# Patient Record
Sex: Male | Born: 1990
Health system: Southern US, Community
[De-identification: ages and names within clinical notes are randomized; demographics above are authoritative.]

## PROBLEM LIST (undated history)

## (undated) DIAGNOSIS — T7840XA Allergy, unspecified, initial encounter: Secondary | ICD-10-CM

## (undated) HISTORY — PX: ANTERIOR CRUCIATE LIGAMENT REPAIR: SHX115

## (undated) HISTORY — DX: Allergy, unspecified, initial encounter: T78.40XA

---
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
## 2010-02-04 ENCOUNTER — Telehealth: Payer: Self-pay | Admitting: Family Medicine

## 2010-02-11 ENCOUNTER — Encounter: Payer: Self-pay | Admitting: Family Medicine

## 2010-02-24 ENCOUNTER — Ambulatory Visit: Payer: Self-pay | Admitting: Family Medicine

## 2010-02-24 DIAGNOSIS — E663 Overweight: Secondary | ICD-10-CM | POA: Insufficient documentation

## 2010-03-31 ENCOUNTER — Ambulatory Visit: Payer: Self-pay | Admitting: Family Medicine

## 2010-03-31 DIAGNOSIS — F988 Other specified behavioral and emotional disorders with onset usually occurring in childhood and adolescence: Secondary | ICD-10-CM

## 2010-04-01 ENCOUNTER — Ambulatory Visit: Payer: Self-pay | Admitting: Psychology

## 2010-04-08 ENCOUNTER — Ambulatory Visit: Payer: Self-pay | Admitting: Psychology

## 2010-04-09 ENCOUNTER — Ambulatory Visit: Payer: Self-pay | Admitting: Psychology

## 2010-04-09 ENCOUNTER — Telehealth: Payer: Self-pay | Admitting: Family Medicine

## 2010-04-09 ENCOUNTER — Encounter: Payer: Self-pay | Admitting: Family Medicine

## 2010-05-02 ENCOUNTER — Telehealth: Payer: Self-pay | Admitting: Family Medicine

## 2010-05-22 ENCOUNTER — Telehealth: Payer: Self-pay | Admitting: Family Medicine

## 2010-05-27 ENCOUNTER — Telehealth: Payer: Self-pay | Admitting: Family Medicine

## 2010-06-13 ENCOUNTER — Ambulatory Visit: Payer: Self-pay | Admitting: Family Medicine

## 2010-06-13 DIAGNOSIS — J45909 Unspecified asthma, uncomplicated: Secondary | ICD-10-CM | POA: Insufficient documentation

## 2010-07-15 ENCOUNTER — Telehealth: Payer: Self-pay | Admitting: Family Medicine

## 2010-08-22 ENCOUNTER — Telehealth: Payer: Self-pay | Admitting: Family Medicine

## 2010-10-07 NOTE — Progress Notes (Signed)
Summary: wants to change to vyvanse  Phone Note Call from Patient Call back at Home Phone [phone]   Caller: Mom- Ala Bent Summary of Call: Mother picked up ritalin script yesterday but is unable to find a pharmacy that has this dose in stock.  One pharmacy told her there is a Set designer problem.  She is asking if pt can change to vyvanse, which some of his friends are on. Initial call taken by: Lowella Petties CMA,  May 27, 2010 9:51 AM  Follow-up for Phone Call        I will call and d/w patient.  Follow-up by: [name] MD,  May 27, 2010 1:59 PM  Additional Follow-up for Phone Call Additional follow up Details #1::        I called patient and d/w him.  We are both okay with the change.  I gave routine precautions for this med.  I want his mother to drop off the old rx and pick up the new rx for vyvanse.  He'll let me know if he has any trouble on vyvanse.   Additional Follow-up by: [name] MD,  May 27, 2010 3:15 PM    Additional Follow-up for Phone Call Additional follow up Details #2::    Mom advised.  Prescription left at front desk.  Follow-up by: Delilah Shan CMA (AAMA),  May 27, 2010 3:24 PM  New/Updated Medications: VYVANSE 30 MG CAPS (LISDEXAMFETAMINE DIMESYLATE) 1 by mouth qam Prescriptions: VYVANSE 30 MG CAPS (LISDEXAMFETAMINE DIMESYLATE) 1 by mouth qam  #30 x 0   Entered and Authorized by:   [name] MD   Signed by:   [name] MD on 05/27/2010   Method used:   Print then Give to Patient   RxID:   [id number]

## 2010-10-07 NOTE — Letter (Signed)
Summary: [facility]  Dr.Jane Perrin,Behavioral Medicine Note   Imported By: Beau Fanny 04/22/2010 13:45:16  _____________________________________________________________________  External Attachment:    Type:   Image     Comment:   External Document

## 2010-10-07 NOTE — Progress Notes (Signed)
Summary: pt requests phone call  Phone Note Call from Patient Call back at [phone]   Caller: Patient Call For: [name] MD Summary of Call: Pt states that current dose of ritalin is not strong enough.  He would like for you to call him to discuss. Initial call taken by: Lowella Petties CMA,  May 22, 2010 11:49 AM  Follow-up for Phone Call        d/w pt.  No intolerance of med but lack of effect noted.  I d/w pt [id number].  We will change to regular ritalin 10mg , 2po qAM and 1-2 by mouth later in the day.  Most of his studying/classes are from 9-3, so this may provide enough help.  If no relief, either followint up at his school clinic or possibly changing to vyvanse could be considered.  He'll call back with update.  Rx is printed and signed.  Please notify his mother so she can pick it up.  Thanks.  Follow-up by: [name] MD,  May 22, 2010 1:53 PM  Additional Follow-up for Phone Call Additional follow up Details #1::        Left message with Dad for pickup. Lugene Fuquay CMA (AAMA)  May 22, 2010 5:16 PM     New/Updated Medications: RITALIN 10 MG TABS (METHYLPHENIDATE HCL) 2 by mouth qAM and 1-2 by mouth again 4 hours later Prescriptions: RITALIN 10 MG TABS (METHYLPHENIDATE HCL) 2 by mouth qAM and 1-2 by mouth again 4 hours later  #120 x 0   Entered and Authorized by:   [name] MD   Signed by:   [name] MD on 05/22/2010   Method used:   Print then Give to Patient   RxID:   [id number]

## 2010-10-07 NOTE — Progress Notes (Signed)
Summary: [facility] order  Phone Note Call from Patient Call back at Home Phone [phone]   Caller: Mom Call For: Shaune Leeks MD Summary of Call: Patient needs an order for labs to be drawn at [facility].  He is scheduled for a physical on 02/18/2010.  Mom says he is 88 but he is very over-weight and any labs that need to be drawn are free to them at [facility].  Please send order to home address. Initial call taken by: Delilah Shan CMA Duncan Dull),  Feb 04, 2010 11:33 AM  Follow-up for Phone Call        Will get Bmet, Hepatic Panel, CBC, TSH,T4Fr, Chol Prof, AM Cortisol, CPeptide. Request in out box. Follow-up by: Shaune Leeks MD,  February 05, 2010 7:16 AM  Additional Follow-up for Phone Call Additional follow up Details #1::        Order mailed as requested Additional Follow-up by: Delilah Shan CMA Duncan Dull),  February 05, 2010 8:54 AM

## 2010-10-07 NOTE — Progress Notes (Signed)
Summary: pt requests phone call  Phone Note Refill Request Call back at [phone] Message from:  Patient  Refills Requested: Medication #1:  VYVANSE 50 MG CAPS 1 by mouth qAM Pt says this is not working for him.  He has problems waking up and is having difficulty focusing.  He says this is keeping him too calm.  He is requesting that you call him regarding this.  He is asking to go back on ritalin.  He says the pharmacies in [place] have the brand name.  Initial call taken by: Lowella Petties CMA, AAMA,  July 15, 2010 4:38 PM  Follow-up for Phone Call        I talked to the patient.  We had switched off ritalin due to availability issues.  He hasn't had good effect with vyvanse.  His energy level is decreased with this.  We'll switch back to ritalin 10mg , 2 by mouth qAM and 1-2 by mouth qday at lunch time.  His mother is going to call tomorrow to make arrangements about picking up the rx.    Pt denies substance use.  His concentration level has decreased on the vyvanse compared to the ritalin.  Follow-up by: [name] MD,  July 15, 2010 5:35 PM  Additional Follow-up for Phone Call Additional follow up Details #1::        Prescription left at front desk.  Additional Follow-up by: Delilah Shan CMA (AAMA),  July 16, 2010 2:05 PM    New/Updated Medications: RITALIN 10 MG TABS (METHYLPHENIDATE HCL) 2 by mouth qAM and 1-2 by mouth at lunch time. Prescriptions: RITALIN 10 MG TABS (METHYLPHENIDATE HCL) 2 by mouth qAM and 1-2 by mouth at lunch time.  #120 x 0   Entered by:   Delilah Shan CMA (AAMA)   Authorized by:   [name] MD   Signed by:   Delilah Shan CMA (AAMA) on 07/16/2010   Method used:   Print then Give to Patient   RxID:   [id number] RITALIN 10 MG TABS (METHYLPHENIDATE HCL) 2 by mouth qAM and 1-2 by mouth at lunch time.  #120 x 0   Entered and Authorized by:   [name] MD   Signed by:   [name] MD on 07/15/2010   Method used:   Historical  RxID:   [id number]

## 2010-10-07 NOTE — Progress Notes (Signed)
Summary: regarding ritalin strength  Phone Note Call from Patient Call back at [phone]   Caller: Mom Call For: [name] MD Action Taken: Patient advised to call 911 Summary of Call: Mother picked up script for ritalin 30 mg's but Rob at [place] told her they dont carry that strength, and dont know who would.  He told her they only carry 10 and 20 mg's.  Mother wants to make sure this strength is available, or else she can get two seperate scripts. Initial call taken by: Lowella Petties CMA,  May 02, 2010 2:38 PM  Follow-up for Phone Call        I will print rxs for 10 and 20 mg.  take one of each in the AM for total of 30mg .  Please document that the old rx for 30mg  was destroyed.  Rxs are in my outbox in my office.  Follow-up by: [name] MD,  May 02, 2010 2:41 PM  Additional Follow-up for Phone Call Additional follow up Details #1::        Mother is trying to find a pharmacy that carries 30 mg's.  I told her we will need to have that script back, if she is unable to get it filled, before we can give her the new scripts.               Lowella Petties CMA  May 02, 2010 3:06 PM  Mother called back, is now asking for a script for 10 mg's, to take 3 a day- so that she wont have to pay for 2 scripts. Additional Follow-up by: Lowella Petties CMA,  May 02, 2010 3:12 PM    Additional Follow-up for Phone Call Additional follow up Details #2::    done, please document that the prev were destroyed.  new rx is in my outbox.  thanks.  Follow-up by: [name] MD,  May 02, 2010 3:15 PM  Additional Follow-up for Phone Call Additional follow up Details #3:: Details for Additional Follow-up Action Taken: Mother given new script, other scripts ripped up and thrown away in shred box. Additional Follow-up by: Lowella Petties CMA,  May 02, 2010 4:16 PM  New/Updated Medications: RITALIN LA 10 MG XR24H-CAP (METHYLPHENIDATE HCL) 1 by mouth qAM RITALIN LA 10 MG XR24H-CAP  (METHYLPHENIDATE HCL) 3 by mouth qAM RITALIN LA 20 MG XR24H-CAP (METHYLPHENIDATE HCL) 1 by mouth qAM Prescriptions: RITALIN LA 10 MG XR24H-CAP (METHYLPHENIDATE HCL) 3 by mouth qAM  #90 x 0   Entered and Authorized by:   [name] MD   Signed by:   [name] MD on 05/02/2010   Method used:   Print then Give to Patient   RxID:   [id number] RITALIN LA 20 MG XR24H-CAP (METHYLPHENIDATE HCL) 1 by mouth qAM  #30 x 0   Entered and Authorized by:   [name] MD   Signed by:   [name] MD on 05/02/2010   Method used:   Print then Give to Patient   RxID:   [id number] RITALIN LA 10 MG XR24H-CAP (METHYLPHENIDATE HCL) 1 by mouth qAM  #30 x 0   Entered and Authorized by:   [name] MD   Signed by:   [name] MD on 05/02/2010   Method used:   Print then Give to Patient   RxID:   [id number]

## 2010-10-07 NOTE — Assessment & Plan Note (Signed)
Summary: DISCUSS MEDICATION/CLE   Vital Signs:  Patient profile:   20 year old male Height:      74 inches Weight:      287.75 pounds BMI:     37.08 Temp:     98.1 degrees F oral Pulse rate:   80 / minute Pulse rhythm:   regular BP sitting:   126 / 84  (left arm) Cuff size:   large  Vitals Entered By: Delilah Shan CMA Duncan Dull) (June 13, 2010 12:20 PM) CC: Discuss medication   History of Present Illness: H/o episodic asthma/wheeze flare.  Needs refill on SABA.  ADD- See prev phone notes.  Little relief with ritalin in terms of duration.  Has taken vyvanse w/o adverse effect.  Duration is better but peak effect of med doesn't control symptoms.  Compliant with meds:yes benefit from med (ie increase in concentration): mild change in mood:no change in appetite:no Insomnia:no tremor:no compliant with behavioral modification: yes, uses phone to stay organized.  Motivated Consulting civil engineer.   Allergies: 1)  ! Augmentin 2)  ! Duricef  Past History:  Past Medical History: Allergic Rhinitis  05/1997 ADD Asthma  Review of Systems       See HPI.  Otherwise negative.    Physical Exam  General:  GEN: nad, alert and oriented, affect wnl and appropriate HEENT: mucous membranes moist NECK: supple w/o LA CV: rrr.  PULM: ctab, no inc wob ABD: soft, +bs EXT: no edema CN 2-12 wnl, s/s/dtr wnl x4.  No tremor.   able to recall 7 digits forward easily but trouble with reversing 4 digits, on meds today.    Impression & Recommendations:  Problem # 1:  ASTHMA (ICD-493.90) Refill SABA and notify clinic if needing more than 2x/week His updated medication list for this problem includes:    Proair Hfa 108 (90 Base) Mcg/act Aers (Albuterol sulfate) .Marland Kitchen... 2 puffs q4h as needed for wheezing  Problem # 2:  ADD (ICD-314.00) >25 min spent with patient, at least half of which was spent on counseling ZO:XWRU.  Will increase vyvanse and patient to call back with update.  He agrees with the plan, as  does his mother who was here today.  BP is okay and he has tolerated the med.    Complete Medication List: 1)  Zyrtec Allergy 10 Mg Tabs (Cetirizine hcl) .... One tab by mouth at night as needed 2)  Vyvanse 50 Mg Caps (Lisdexamfetamine dimesylate) .Marland Kitchen.. 1 by mouth qam 3)  Proair Hfa 108 (90 Base) Mcg/act Aers (Albuterol sulfate) .... 2 puffs q4h as needed for wheezing  Patient Instructions: 1)  Use the higher dose of vyvanse and let me know how you are doing.  Use the albuterol as needed.  Take care.  Prescriptions: PROAIR HFA 108 (90 BASE) MCG/ACT AERS (ALBUTEROL SULFATE) 2 puffs q4h as needed for wheezing  #1 x 12   Entered and Authorized by:   [name] MD   Signed by:   [name] MD on 06/13/2010   Method used:   Print then Give to Patient   RxID:   [id number] VYVANSE 50 MG CAPS (LISDEXAMFETAMINE DIMESYLATE) 1 by mouth qAM  #30 x 0   Entered and Authorized by:   [name] MD   Signed by:   [name] MD on 06/13/2010   Method used:   Print then Give to Patient   RxID:   [id number]   Current Allergies (reviewed today): ! AUGMENTIN ! DURICEF

## 2010-10-07 NOTE — Progress Notes (Signed)
  Phone Note Outgoing Call   Call placed by: [name] MD,  April 09, 2010 5:28 PM Summary of Call: I called patient.  We discussed his ADD testing.  He may benefit from treatment.  I d/w him re: possible adverse effects (including sleep and mood changes, tremor, dec in appetite).  He understood.  He knows not to take the medicine in any way other than as prescribed.  Failure to comply would prevent further prescribing and he understood.  He will take the med two times a day, before late morning classes and then again in the afternoon before studying.  He will call back with update in about 1-2 weeks.  He'll pick up the rx tomorrow here at the clinic.  He knows that he is not to abuse this medication.  he agrees with the plan.  We can determine the timing for follow up (and the arrangements for picking up further rxs) based on his response to the meds.  he knows not to use any illicit agents.  Per Raechel Ache, M.D./lsf  Follow-up for Phone Call        Patient Advised. Prescription left at front desk.  Follow-up by: Delilah Shan CMA (AAMA),  April 10, 2010 8:30 AM    New/Updated Medications: RITALIN 10 MG TABS (METHYLPHENIDATE HCL) 1 by mouth two times a day Prescriptions: RITALIN 10 MG TABS (METHYLPHENIDATE HCL) 1 by mouth two times a day  #60 x 0   Entered and Authorized by:   [name] MD   Signed by:   [name] MD on 04/09/2010   Method used:   Print then Give to Patient   RxID:   [id number]

## 2010-10-07 NOTE — Consult Note (Signed)
Summary: [facility]  [facility]   Imported By: Lanelle Bal 04/18/2010 09:08:36  _____________________________________________________________________  External Attachment:    Type:   Image     Comment:   External Document

## 2010-10-07 NOTE — Assessment & Plan Note (Signed)
Summary: ATTENTION DEFICIT EVAL AND TRMT PER DR. SCHALLER   Vital Signs:  Patient profile:   20 year old male Weight:      298.75 pounds Temp:     98.0 degrees F oral Pulse rate:   68 / minute Pulse rhythm:   regular BP sitting:   128 / 82  (left arm) Cuff size:   large  Vitals Entered By: Sydell Axon LPN (March 31, 2010 9:16 AM) CC: ADD evaluation   History of Present Illness: Possible ADD. "It's always been a problem but I didn't see until I was in college."  Trouble concentrating and focusing.  Difficulty studying unless patient has very quiet and isolated environment.  Academic probation 1st semester (biology, english, econ) and dean's list second semester.  Pt had lighter load in second semester.  "I'm good at tests.  The classes were easier back in high school and I was getting by."  Wants to go to vet school.  Has worked to improve study habits.   No tob, rare alcohol. No illicits.  Living off campus.  Going back to [facility] August 5th.  Apartment is disorganized.  "I'm not good with organization."   Some help with lists.    3 speeding tickets.  Brother with possible ADHD.    Patient was prev dx'd with ADD in 2nd grade.  Was never on meds.  No SI/HI.    Problems Prior to Update: 1)  ? of Add  (ICD-314.00) 2)  Overweight  (ICD-278.02) 3)  Health Maintenance Exam  (ICD-V70.0) 4)  Allergic Rhinitis  (ICD-477.9) 5)  S/P Allergic Rhinitis  (ICD-477.9)  Allergies: 1)  ! Augmentin 2)  ! Duricef  Social History: Siblings:1 Scientist, research (physical sciences), wants to go to [employer] school.  [employer] grad no illicits, no tob.  minimal alcohol   Review of Systems       See HPI.  Otherwise negative.    Physical Exam  General:  GEN: nad, alert and oriented, affect wnl and appropriate HEENT: mucous membranes moist NECK: supple w/o LA CV: rrr.  PULM: ctab, no inc wob ABD: soft, +bs EXT: no edema CN 2-12 wnl, s/s/dtr wnl x4.  No tremor.     Impression & Recommendations:  Problem # 1:  ? of  ADD (ICD-314.00) 25 min spent with patient.  Refer for testing and contact patient after that is done.  I can call patient and discuss with him.  No meds at this point.  He agrees with plan.  I talked with the patient about basics of treatment (meds and behavior/lifestyle changes).    Orders: Psychology Referral (Psychology)  Complete Medication List: 1)  Zyrtec Allergy 10 Mg Tabs (Cetirizine hcl) .... One tab by mouth at night 2)  Fluticasone Propionate 50 Mcg/act Susp (Fluticasone propionate) .... One inh each nostril two times a day as needed  Patient Instructions: 1)  See Aram Beecham about your referral before your leave today.  We'll contact you with your test report.    Current Allergies (reviewed today): ! AUGMENTIN ! DURICEF

## 2010-10-07 NOTE — Progress Notes (Signed)
Summary: ritalin is not helping  Phone Note Call from Patient Call back at [phone]   Caller: Patient Summary of Call: Pt was prescribed generic ritalin 3 or 4 weeks ago and this is not helping at all.  He has tried taking 2 in the mornings, because all of his classes are in the morning,  this helped for awhile, but now not at all.  He didnt take the mid day dose when he took 2 in the mornings. Initial call taken by: Lowella Petties CMA,  May 02, 2010 11:27 AM  Follow-up for Phone Call        Called and LMOVM.  Will await return call.  Follow-up by: [name] MD,  May 02, 2010 11:45 AM  Additional Follow-up for Phone Call Additional follow up Details #1::        Patient returned  call. [phone] Melody Comas  May 02, 2010 11:46 AM     Additional Follow-up for Phone Call Additional follow up Details #2::    I called back.  Pt had some effect intially but it wore off quickly.  He is almost of the medicine. I talked with him about options.  We can increase the total dosage by 10mg  and change to LA form.  he had no mood changes or tremor on the prev dose.  He'll report back re: symptoms/effect.  A family member will come pick up the rx today.  Follow-up by: [name] MD,  May 02, 2010 1:54 PM  Additional Follow-up for Phone Call Additional follow up Details #3:: Details for Additional Follow-up Action Taken: Prescription left at front desk.  Additional Follow-up by: Delilah Shan CMA (AAMA),  May 02, 2010 2:07 PM  New/Updated Medications: RITALIN LA 30 MG XR24H-CAP (METHYLPHENIDATE HCL) 1 by mouth qAM Prescriptions: RITALIN LA 30 MG XR24H-CAP (METHYLPHENIDATE HCL) 1 by mouth qAM  #30 x 0   Entered and Authorized by:   [name] MD   Signed by:   [name] MD on 05/02/2010   Method used:   Print then Give to Patient   RxID:   [id number]

## 2010-10-07 NOTE — Assessment & Plan Note (Signed)
Summary: CPX/CLE   Vital Signs:  Patient profile:   20 year old male Height:      74 inches Weight:      294.25 pounds BMI:     37.92 Temp:     97.4 degrees F oral Pulse rate:   68 / minute Pulse rhythm:   regular BP sitting:   110 / 74  (left arm) Cuff size:   large  Vitals Entered By: Sydell Axon LPN (February 24, 2010 8:24 AM) CC: 30 Minute checkup   History of Present Illness: Pt here for PE, going to [employer] in [place]. He is hoping to go to [employer]. He was on the Dean's List this last semester. Pt says he is worried about his weight. He has tried "everything" to lose weight.  Pt also OBTW mentions difficulty concentrating and staying focused on school work and studying.  Preventive Screening-Counseling & Management  Alcohol-Tobacco     Alcohol drinks/day: <1     Alcohol type: all     Smoking Status: never  Caffeine-Diet-Exercise     Caffeine use/day: 1     Does Patient Exercise: yes     Type of exercise: running, exercises     Exercise (avg: min/session): 30-60     Times/week: 3  Problems Prior to Update: 1)  Chest Pain, Acute  (ICD-786.50) 2)  Health Maintenance Exam  (ICD-V70.0) 3)  Allergic Rhinitis  (ICD-477.9) 4)  S/P Allergic Rhinitis  (ICD-477.9)  Medications Prior to Update: 1)  Singulair 10 Mg Tabs (Montelukast Sodium) .Marland Kitchen.. 1 Qd 2)  Zyrtec Allergy 10 Mg  Tabs (Cetirizine Hcl) .... One Tab By Mouth At Night  Allergies: 1)  ! Augmentin 2)  ! Duricef  Past History:  Past Medical History: Last updated: 01/03/2007 Allergic Rhinitis  05/1997  Past Surgical History: Last updated: 10/17/2008 CT Abd/Pelvis nml 04/09/07 R ACL Reconstruction (Dr Thomasena Edis)  08/25/2007  Family History: Last updated: 02/24/2010 Father: ALIVE 53  Mother: ALIVE 32 BROTHER A 23 CV: NEGATIVE  HBP: NEGATIVE GOUT/ARTHRITIS: NEG. PROSTATE/CANCER: NEG. BREAST/OVARIAN/UTERINE CANCER NEGATIVE COLON CANCER:  DEPRESSION: NEGATIVE ETOH/DRUG ABUSE:  NEGATIVE OTHER: NEGATIVE STROKE  Social History: Last updated: 04/05/2007 Mother: Father:  Siblings:1 BROTHER OLDER  School name: [employer] Grade:  Hobbies:  Risk Factors: Alcohol Use: <1 (02/24/2010) Caffeine Use: 1 (02/24/2010) Exercise: yes (02/24/2010)  Risk Factors: Smoking Status: never (02/24/2010) Passive Smoke Exposure: no (01/06/2008)  Family History: Father: ALIVE 23  Mother: ALIVE 44 BROTHER A 23 CV: NEGATIVE  HBP: NEGATIVE GOUT/ARTHRITIS: NEG. PROSTATE/CANCER: NEG. BREAST/OVARIAN/UTERINE CANCER NEGATIVE COLON CANCER:  DEPRESSION: NEGATIVE ETOH/DRUG ABUSE: NEGATIVE OTHER: NEGATIVE STROKE  Physical Exam  General:  Well-developed,well-nourished,in no acute distress; alert,appropriate and cooperative throughout examination, stocky but significantly better proportioned than previously. Head:  Normocephalic and atraumatic without obvious abnormalities. No apparent alopecia or balding. Sinuses NT. Eyes:  Conjunctiva clear bilaterally.  Ears:  External ear exam shows no significant lesions or deformities.  Otoscopic examination reveals clear canals, tympanic membranes are intact bilaterally without bulging, retraction, inflammation or discharge. Hearing is grossly normal bilaterally. Nose:  External nasal examination shows no deformity or inflammation. Nasal mucosa are pink and moist without lesions or exudates. Mild congestion of nasal passages with dried mucous. Mouth:  Oral mucosa and oropharynx without lesions or exudates.  Teeth in good repair. Neck:  No deformities, masses, or tenderness noted. Chest Wall:  No deformities, masses, tenderness or gynecomastia noted. Breasts:  No masses, mild physiologic gynecomastia noted Lungs:  Normal respiratory  effort, chest expands symmetrically. Lungs are clear to auscultation, no crackles or wheezes. Heart:  Normal rate and regular rhythm. S1 and S2 normal without gallop, murmur, click, rub or other extra  sounds. Abdomen:  Bowel sounds positive,abdomen soft and non-tender without masses, organomegaly or hernias noted. Rectal:  Not done. Genitalia:  Testes bilaterally descended without nodularity, tenderness or masses. No scrotal masses or lesions. No penis lesions or urethral discharge. Msk:  No deformity or scoliosis noted of thoracic or lumbar spine.   Pulses:  R and L carotid,radial,femoral,dorsalis pedis and posterior tibial pulses are full and equal bilaterally Extremities:  No clubbing, cyanosis, edema, or deformity noted with normal full range of motion of all joints.   Neurologic:  No cranial nerve deficits noted. Station and gait are normal. Sensory, motor and coordinative functions appear intact. Skin:  Intact without suspicious lesions or rashes Cervical Nodes:  No lymphadenopathy noted Inguinal Nodes:  No significant adenopathy Psych:  Cognition and judgment appear intact. Alert and cooperative with normal attention span and concentration. No apparent delusions, illusions, hallucinations   Impression & Recommendations:  Problem # 1:  HEALTH MAINTENANCE EXAM (ICD-V70.0)  Reviewed preventive care protocols, scheduled due services, and updated immunizations. Discussed diet and exercise. Suggested reading "Protein Power" Discussed ETOH, sex, drugs and smoking.  Problem # 2:  CHEST PAIN, ACUTE (ICD-786.50) Assessment: Improved Resolved.  Problem # 3:  ALLERGIC RHINITIS (ICD-477.9) Assessment: Unchanged Refer to allergy for eval. Try Fluticasone in the interim Nasally. His updated medication list for this problem includes:    Zyrtec Allergy 10 Mg Tabs (Cetirizine hcl) ..... One tab by mouth at night    Fluticasone Propionate 50 Mcg/act Susp (Fluticasone propionate) ..... One inh each nostril two times a day as needed  Orders: Allergy Referral  (Allergy)  Problem # 4:  OVERWEIGHT (ICD-278.02) Assessment: Unchanged Discussed diet and exercise.  Complete Medication  List: 1)  Zyrtec Allergy 10 Mg Tabs (Cetirizine hcl) .... One tab by mouth at night 2)  Fluticasone Propionate 50 Mcg/act Susp (Fluticasone propionate) .... One inh each nostril two times a day as needed  Patient Instructions: 1)  Refer for alllergy eval. 2)  Make appt with Dr Para March as soon as possible for Atenetion Deficit eval and trmt.  Prescriptions: FLUTICASONE PROPIONATE 50 MCG/ACT SUSP (FLUTICASONE PROPIONATE) one inh each nostril two times a day as needed  #1MDI x 12   Entered and Authorized by:   Shaune Leeks MD   Signed by:   Shaune Leeks MD on 02/24/2010   Method used:   Print then Give to Patient   RxID:   [id number]   Current Allergies (reviewed today): ! AUGMENTIN ! DURICEF

## 2010-10-09 NOTE — Progress Notes (Signed)
Summary: refill request for ritalin  Phone Note Refill Request Call back at [phone] Message from:  mother Daden Mahany  Refills Requested: Medication #1:  RITALIN 10 MG TABS 2 by mouth qAM and 1-2 by mouth at lunch time. Please call mother when ready, monday is fine.  Initial call taken by: Lowella Petties CMA, AAMA,  August 22, 2010 3:23 PM  Follow-up for Phone Call        rx signed.  Follow-up by: [name] MD,  August 22, 2010 3:30 PM  Additional Follow-up for Phone Call Additional follow up Details #1::        Mother notified of rx. Rx left in front office for pick up.  Additional Follow-up by: Melody Comas,  August 22, 2010 3:40 PM    Prescriptions: RITALIN 10 MG TABS (METHYLPHENIDATE HCL) 2 by mouth qAM and 1-2 by mouth at lunch time.  #120 x 0   Entered and Authorized by:   [name] MD   Signed by:   [name] MD on 08/22/2010   Method used:   Print then Give to Patient   RxID:   [id number]

## 2010-10-24 ENCOUNTER — Telehealth: Payer: Self-pay | Admitting: Family Medicine

## 2010-10-29 NOTE — Progress Notes (Signed)
Summary: Need med refill-Ritailin  Phone Note Call from Patient   Caller: Mom Call For: [name] MD Summary of Call: Mom called, need Ritalin refilled.Marland KitchenMarland KitchenMom call back # [phone].Marland KitchenDaine Gip  October 24, 2010 3:08 PM  Initial call taken by: Daine Gip,  October 24, 2010 3:08 PM  Follow-up for Phone Call        please give to patient/mother.  Follow-up by: [name] MD,  October 24, 2010 3:33 PM  Additional Follow-up for Phone Call Additional follow up Details #1::        Mom advised.  Prescription left at front desk.  Additional Follow-up by: Delilah Shan CMA (AAMA),  October 24, 2010 3:55 PM    Prescriptions: RITALIN 10 MG TABS (METHYLPHENIDATE HCL) 2 by mouth qAM and 1-2 by mouth at lunch time.  #120 x 0   Entered and Authorized by:   [name] MD   Signed by:   [name] MD on 10/24/2010   Method used:   Print then Give to Patient   RxID:   [id number]

## 2010-12-31 ENCOUNTER — Other Ambulatory Visit: Payer: Self-pay | Admitting: *Deleted

## 2010-12-31 MED ORDER — METHYLPHENIDATE HCL ER (LA) 10 MG PO CP24: ORAL_CAPSULE | ORAL | Status: DC

## 2010-12-31 NOTE — Telephone Encounter (Signed)
Mom picked up Rx

## 2010-12-31 NOTE — Telephone Encounter (Signed)
Have pt schedule appointment for the next 2-3 months.  Thanks.

## 2011-01-20 NOTE — Assessment & Plan Note (Signed)
[facility]                                 ON-CALL NOTE   DELMONT, PROSCH                         MRN:          [id number]  DATE:04/09/2007                            DOB:          [birth year]    TIME OF CALL:  10:39am.   REGULAR DOCTOR:  Dr. Hetty Ely.   TELEPHONE NUMBER:  [phone]   CALLER:  Ala Bent.   She called to say that Marty has had an event with possible broken ribs.  They were told to go ahead and take him to the emergency room for  evaluation and x-rays.     Marne A. Tower, MD  Electronically Signed    MAT/MedQ  DD: 04/09/2007  DT: 04/10/2007  Job #: [id number]   cc:   Arta Silence, MD

## 2011-04-21 ENCOUNTER — Other Ambulatory Visit: Payer: Self-pay | Admitting: *Deleted

## 2011-04-21 MED ORDER — METHYLPHENIDATE HCL ER (LA) 10 MG PO CP24: ORAL_CAPSULE | ORAL | Status: DC

## 2011-04-21 MED ORDER — METHYLPHENIDATE HCL 10 MG PO TABS: ORAL_TABLET | ORAL | Status: DC

## 2011-04-21 NOTE — Telephone Encounter (Signed)
Please phone patient when Rx is ready for pick up 

## 2011-04-21 NOTE — Telephone Encounter (Signed)
Please give to pt.  rx is in my office.

## 2011-04-21 NOTE — Telephone Encounter (Signed)
Please call patient when ready. 

## 2011-04-22 NOTE — Telephone Encounter (Signed)
This is 2 different Rx's for Ritalin.  I only have a Rx on my desk for one.  Has the other one been done previously?  If not, shall I ask one of the other MD's to do it since you are out of the office until Monday?

## 2011-04-22 NOTE — Telephone Encounter (Signed)
The first was printed in error and destroyed.  He should just have the current rx for plain ritalin, no the LA.  Thanks.

## 2011-04-23 ENCOUNTER — Telehealth: Payer: Self-pay | Admitting: *Deleted

## 2011-04-23 NOTE — Telephone Encounter (Signed)
LMOVM of cell phone advising Rx is ready for pick up and Rx placed at the front desk.

## 2011-04-24 NOTE — Telephone Encounter (Signed)
Patient advised, Rx placed at front desk for pick up.

## 2011-06-22 LAB — HEPATIC FUNCTION PANEL
AST: 38 — ABNORMAL HIGH
Albumin: 4.5
Alkaline Phosphatase: 88
Bilirubin, Direct: <0.1
Total Bilirubin: 1

## 2011-06-22 LAB — URINALYSIS, ROUTINE W REFLEX MICROSCOPIC
Bilirubin Urine: NEGATIVE
Glucose, UA: NEGATIVE
Ketones, ur: NEGATIVE
Protein, ur: NEGATIVE
pH: 6.5

## 2011-06-22 LAB — CBC
Hemoglobin: 15.2
MCHC: 34.1
MCV: 85.1
RBC: 5.24
WBC: 8.4

## 2011-06-22 LAB — I-STAT 8, (EC8 V) (CONVERTED LAB)
BUN: 21
Chloride: 105
Glucose, Bld: 88
HCT: 49
Hemoglobin: 16.7 — ABNORMAL HIGH
Operator id: 270111
Potassium: 4.2
Sodium: 138

## 2011-06-22 LAB — DIFFERENTIAL
Basophils Relative: 1
Eosinophils Absolute: 0.1
Lymphs Abs: 2
Monocytes Absolute: 0.8
Monocytes Relative: 9

## 2013-04-10 ENCOUNTER — Ambulatory Visit (INDEPENDENT_AMBULATORY_CARE_PROVIDER_SITE_OTHER): Payer: 59 | Admitting: Family Medicine

## 2013-04-10 ENCOUNTER — Telehealth: Payer: Self-pay | Admitting: Family Medicine

## 2013-04-10 ENCOUNTER — Encounter: Payer: Self-pay | Admitting: Family Medicine

## 2013-04-10 VITALS — BP 132/90 | HR 80 | Temp 98.3°F | Ht 75.0 in | Wt 324.5 lb

## 2013-04-10 DIAGNOSIS — L255 Unspecified contact dermatitis due to plants, except food: Secondary | ICD-10-CM

## 2013-04-10 DIAGNOSIS — L237 Allergic contact dermatitis due to plants, except food: Secondary | ICD-10-CM

## 2013-04-10 DIAGNOSIS — G43009 Migraine without aura, not intractable, without status migrainosus: Secondary | ICD-10-CM | POA: Insufficient documentation

## 2013-04-10 MED ORDER — MOMETASONE FUROATE 0.1 % EX CREA: TOPICAL_CREAM | Freq: Every day | CUTANEOUS | Status: DC

## 2013-04-10 MED ORDER — CYCLOBENZAPRINE HCL 10 MG PO TABS: 10.0000 mg | ORAL_TABLET | Freq: Two times a day (BID) | ORAL | Status: DC | PRN

## 2013-04-10 NOTE — Assessment & Plan Note (Signed)
Will tx with elocon and antihistamine oral prn itch  inst to keep cool and clean  Some irritation from calamine lotion-inst to stop that Disc s/s of bacterial infection- will update if any concerns or no imp

## 2013-04-10 NOTE — Progress Notes (Signed)
Subjective:    Patient ID: Tim Alvarado, male    DOB: 1991/02/10, 22 y.o.   MRN: [id number]  HPI Here with a rash behind R knee and headaches Exposed 9 days ago [place] the woods [place] on  Rash started soon afterwards with 2 lines of blisters - then used calamine and ivyrest  Used a little hydrocortisone cream  ? Some itching  Now the redness has spread   Uses ace with a gauze ped   Also migraine headache daily for 11/2 month  Nothing new  Had migraines as a kid  No major changes and no new stressors   Non smoker Has allergies that are well controlled  Smells used to be a big trigger - also ? Mold exposure or methane  Caffeine once per day - tea or coffee   Headache starts in R side of head behind ear- goes along with neck pain  6-7 on pain scale  Comes and goes  occ wakes up with it No n/v or light sensitivity and worse with exertion  No aura   Usually just goes to bed  excedrin migraine does help- for about a day    Has newly dx sleep apnea  Starting cpap soon Also needs tonsils out  Also needs wt loss  Patient Active Problem List   Diagnosis Date Noted  . Poison ivy dermatitis 04/10/2013  . Migraine headache without aura 04/10/2013  . ASTHMA 06/13/2010  . ADD 03/31/2010  . OVERWEIGHT 02/24/2010  . ALLERGIC RHINITIS 01/03/2007   No past medical history on file. No past surgical history on file. History  Substance Use Topics  . Smoking status: Never Smoker   . Smokeless tobacco: Not on file  . Alcohol Use: Yes     Comment: OCC   No family history on file. Allergies  Allergen Reactions  . Amoxicillin-Pot Clavulanate     REACTION: GI UPSET  . Cefadroxil     REACTION: RASH   No current outpatient prescriptions on file prior to visit.   No current facility-administered medications on file prior to visit.    Review of Systems Review of Systems  Constitutional: Negative for fever, appetite change,  and unexpected weight change. pos for  fatigue ENT pos for chronic nasal congestion  Eyes: Negative for pain and visual disturbance.  Respiratory: Negative for cough and shortness of breath.   Cardiovascular: Negative for cp or palpitations    Gastrointestinal: Negative for nausea, diarrhea and constipation.  Genitourinary: Negative for urgency and frequency.  Skin: Negative for pallor or rash   MSK pos for neck tension Neurological: Negative for weakness, light-headedness, numbness and pos for ha  Hematological: Negative for adenopathy. Does not bruise/bleed easily.  Psychiatric/Behavioral: Negative for dysphoric mood. The patient is not nervous/anxious.         Objective:   Physical Exam  Constitutional: He appears well-developed and well-nourished. No distress.  obese and well appearing   HENT:  Head: Normocephalic and atraumatic.  Right Ear: External ear normal.  Left Ear: External ear normal.  Mouth/Throat: Oropharynx is clear and moist. No oropharyngeal exudate.  Nares are boggy Very large tonsils  Eyes: Conjunctivae and EOM are normal. Pupils are equal, round, and reactive to light. Right eye exhibits no discharge. Left eye exhibits no discharge. No scleral icterus.  Neck: Normal range of motion. Neck supple. No JVD present. Carotid bruit is not present. No tracheal deviation present. No thyromegaly present.  Cardiovascular: Normal rate and regular rhythm.  Pulmonary/Chest: Effort normal and breath sounds normal. No respiratory distress. He has no wheezes. He has no rales.  Abdominal: Soft. Bowel sounds are normal. He exhibits no distension and no mass. There is no tenderness.  Musculoskeletal: He exhibits no edema and no tenderness.  Lymphadenopathy:    He has no cervical adenopathy.  Neurological: He is alert. He has normal reflexes. He displays no atrophy and no tremor. No cranial nerve deficit or sensory deficit. He exhibits normal muscle tone. Coordination and gait normal.  Skin: Skin is warm and dry. Rash  noted. There is erythema. No pallor.  Healing dermatitis behind R knee- dry erythema with some scab formation from old vesicles  Not warm to the touch No oozing   Psychiatric: He has a normal mood and affect.          Assessment & Plan:

## 2013-04-10 NOTE — Assessment & Plan Note (Signed)
Long disc about recurrent migraine and handouts given  Rev sleep/ diet habits  Expect imp when he begins cpap In light of R tight neck muscles-will also try flexeril  Disc overuse of analgesics and rebound  Will f/u with PCP if no imp- would need to consider proph tx >25 min spent with face to face with patient, >50% counseling and/or coordinating care

## 2013-04-10 NOTE — Telephone Encounter (Signed)
Patient Information:  Caller Name: Tyquon  Phone: [phone]  Patient: Tim Alvarado, Tim Alvarado  Gender: Male  DOB: 11-10-1990  Age: 22 Years  PCP: [name] Clelia Croft) [facility])  Office Follow Up:  Does the office need to follow up with this patient?: No  Instructions For The Office: N/A  RN Note:  Dr Para March not available until 1615; scheduled within 4 hour parameter with Dr.Tower at 1200.    Symptoms  Reason For Call & Symptoms: Open draining poison ivy in right popliteal space  Reviewed Health History In EMR: Yes  Reviewed Medications In EMR: Yes  Reviewed Allergies In EMR: Yes  Reviewed Surgeries / Procedures: Yes  Date of Onset of Symptoms: 04/02/2013  Treatments Tried: Ivy Dry Calamine lotion, wrapping area  Treatments Tried Worked: No  Guideline(s) Used:  Poison Ivy - [facility] or [facility]  Disposition Per Guideline:   Go to [facility] Now  Reason For Disposition Reached:   Increasing redness around poison ivy and larger than 2 inches (5 cm)  Advice Given:  Hydrocortisone Cream for Itching:   Apply 1% hydrocortisone cream 4 times a day to reduce itching. Use it for 5 days.  Keep the cream in the refrigerator (Reason: it feels better if applied cold).  Apply Cold to the Area:  Soak the involved area in cool water for 20 minutes or massage it with an ice cube as often as necessary to reduce itching and oozing.  Avoid Scratching:   Cut your fingernails short and try not to scratch so as to prevent a secondary infection from bacteria.  Expected Course:  Usually lasts 2 weeks. Treatment reduces the severity of the symptoms, not how long they last.  Call Back If:  Rash lasts longer than 3 weeks  It looks infected  You become worse.  Patient Will Follow Care Advice:  YES  Appointment Scheduled:  04/10/2013 12:00:00 Appointment Scheduled Provider:  Roxy Manns [facility])

## 2013-04-10 NOTE — Patient Instructions (Addendum)
Get started on cpap - helping the sleep apnea may stop the headaches Also avoid caffeine/ drink lots of water/ keep bed and sleep time the same  Try flexeril - muscle relaxer- may sedate so use caution- take for headache as needed  If not improved - follow up with Dr Para March to discuss migraine prophylaxis  For poison ivy - use elocon cream daily and let me know if it worsens or does not improve

## 2013-04-24 ENCOUNTER — Telehealth: Payer: Self-pay | Admitting: Family Medicine

## 2013-04-24 DIAGNOSIS — Z516 Encounter for desensitization to allergens: Secondary | ICD-10-CM

## 2013-04-24 NOTE — Telephone Encounter (Signed)
Per our earlier discussion, pt came in and says he goes to [facility] for allergy injections twice weekly, but he would like to start them local.  He would prefer to go somewhere in [place] if there's a place available, but if not, Tim Alvarado will be ok. Thank you.

## 2013-04-24 NOTE — Telephone Encounter (Signed)
Referral placed.  We'll need to get the records from [facility] sent over.  Thanks.

## 2013-10-13 ENCOUNTER — Ambulatory Visit (INDEPENDENT_AMBULATORY_CARE_PROVIDER_SITE_OTHER): Payer: 59 | Admitting: Emergency Medicine

## 2013-10-13 VITALS — BP 120/80 | HR 74 | Temp 98.0°F | Resp 18 | Ht 74.0 in | Wt 308.0 lb

## 2013-10-13 DIAGNOSIS — J018 Other acute sinusitis: Secondary | ICD-10-CM

## 2013-10-13 DIAGNOSIS — J209 Acute bronchitis, unspecified: Secondary | ICD-10-CM

## 2013-10-13 MED ORDER — LEVOFLOXACIN 500 MG PO TABS: 500.0000 mg | ORAL_TABLET | Freq: Every day | ORAL | Status: AC

## 2013-10-13 MED ORDER — PSEUDOEPHEDRINE-GUAIFENESIN ER 60-600 MG PO TB12: 1.0000 | ORAL_TABLET | Freq: Two times a day (BID) | ORAL | Status: AC

## 2013-10-13 NOTE — Progress Notes (Signed)
[facility] [address], [place] [address] [phone]  Date:  10/13/2013   Name:  LEELYND MALDONADO   DOB:  July 03, 1991   MRN:  [id number]  PCP:  [name], MD    Chief Complaint: Facial Pain, Sore Throat and Headache   History of Present Illness:  KAMAURI DENARDO is a 23 y.o. very pleasant male patient who presents with the following:  Ill three days with nasal congestion, drainage and post nasal drip.  Purulent in nature.  Now has severe headache frontal region.  History of migraines and this is characteristic.  No fever or chills.  Has a productive cough.  No wheezing or shortness of breath.  No nausea or vomiting.  No stool change.  No improvement with over the counter medications or other home remedies. dermatitis   Patient Active Problem List   Diagnosis Date Noted  . Poison ivy dermatitis 04/10/2013  . Migraine headache without aura 04/10/2013  . ASTHMA 06/13/2010  . ADD 03/31/2010  . OVERWEIGHT 02/24/2010  . ALLERGIC RHINITIS 01/03/2007    Past Medical History  Diagnosis Date  . Allergy     Past Surgical History  Procedure Laterality Date  . Anterior cruciate ligament repair    . Joint replacement      History  Substance Use Topics  . Smoking status: Never Smoker   . Smokeless tobacco: Not on file  . Alcohol Use: Yes     Comment: OCC    History reviewed. No pertinent family history.  Allergies  Allergen Reactions  . Amoxicillin-Pot Clavulanate     REACTION: GI UPSET  . Cefadroxil     REACTION: RASH    Medication list has been reviewed and updated.  Current Outpatient Prescriptions on File Prior to Visit  Medication Sig Dispense Refill  . Fexofenadine HCl (ALLEGRA PO) Take 1 tablet by mouth daily.      . NON FORMULARY ALLERGY SHOTS TWICE A WEEK      . Azelastine-Fluticasone (DYMISTA) 137-50 MCG/ACT SUSP Place 1 spray into the nose daily.      . cyclobenzaprine (FLEXERIL) 10 MG tablet Take 1 tablet (10 mg total) by mouth  2 (two) times daily as needed for muscle spasms (as needed for migraine headache).  30 tablet  0  . mometasone (ELOCON) 0.1 % cream Apply topically daily. To affected area  45 g  0  . montelukast (SINGULAIR) 10 MG tablet Take 10 mg by mouth at bedtime.       No current facility-administered medications on file prior to visit.    Review of Systems:  As per HPI, otherwise negative.    Physical Examination: Filed Vitals:   10/13/13 1256  BP: 120/80  Pulse: 74  Temp: 98 F (36.7 C)  Resp: 18   Filed Vitals:   10/13/13 1256  Height: 6\' 2"  (1.88 m)  Weight: 308 lb (139.708 kg)   Body mass index is 39.53 kg/(m^2). Ideal Body Weight: Weight in (lb) to have BMI = 25: 194.3  GEN: WDWN, NAD, Non-toxic, A & O x 3 HEENT: Atraumatic, Normocephalic. Neck supple. No masses, No LAD. Ears and Nose: No external deformity. CV: RRR, No M/G/R. No JVD. No thrill. No extra heart sounds. PULM: CTA B, no wheezes, crackles, rhonchi. No retractions. No resp. distress. No accessory muscle use. ABD: S, NT, ND, +BS. No rebound. No HSM. EXTR: No c/c/e NEURO Normal gait.  PSYCH: Normally interactive. Conversant. Not depressed or anxious appearing.  Calm demeanor.    Assessment and Plan: Sinusitis Bronchitis augmentin mucinex    Signed,  Phillips OdorJeffery Seve Monette, MD

## 2013-10-13 NOTE — Patient Instructions (Signed)

## 2014-04-20 ENCOUNTER — Telehealth: Payer: Self-pay | Admitting: Family Medicine

## 2014-04-20 MED ORDER — PREDNISONE 20 MG PO TABS: ORAL_TABLET | ORAL | Status: DC

## 2014-04-20 NOTE — Telephone Encounter (Signed)
Sent rx.  F/u prn.  Thanks.

## 2014-04-20 NOTE — Telephone Encounter (Signed)
Patient Information:  Caller Name: [name]  Phone: [phone]  Patient: Tim Alvarado, Tim Alvarado  Gender: Male  DOB: [birth year]  Age: 23 Years  PCP: Crawford Givensuncan, Graham Clelia Croft([facility]) [facility] Practice)  Office Follow Up:  Does the office need to follow up with this patient?: Yes  Instructions For The Office: see notes  RN Note:  Advised appt today per triage disp. Pt states he is working and cannot be seen today. He would like a message sent to MD to see if MD will prescribe prednisone. Assured him will send message and someone will f/u with him today. Agreed to plan.  Symptoms  Reason For Call & Symptoms: Poison Ivy face, arms, eye, behind ears. Says he had to see and eye doctor due to having it in his eye. Has had poison ivy before and knows this is what it is. He also knows that he came in contact with the plant on 8/11.  Reviewed Health History In EMR: Yes  Reviewed Medications In EMR: Yes  Reviewed Allergies In EMR: Yes  Reviewed Surgeries / Procedures: Yes  Date of Onset of Symptoms: 04/19/2014  Treatments Tried: steroid cream---says too much area to cover with steroid cream  Treatments Tried Worked: No  Guideline(s) Used:  Poison Ivy - [facility]  Disposition Per Guideline:   See Today in Office  Reason For Disposition Reached:   Severe itching interferes with normal activities (e.g., work or school) or prevents sleep  Advice Given:  N/A  Patient Refused Recommendation:  Patient Requests Prescription  see notes

## 2014-04-20 NOTE — Telephone Encounter (Signed)
Left detailed message on voicemail.  

## 2015-04-04 ENCOUNTER — Encounter: Payer: Self-pay | Admitting: Family Medicine

## 2015-04-04 ENCOUNTER — Ambulatory Visit (INDEPENDENT_AMBULATORY_CARE_PROVIDER_SITE_OTHER): Payer: 59 | Admitting: Family Medicine

## 2015-04-04 VITALS — BP 122/76 | HR 72 | Temp 98.8°F | Wt 307.5 lb

## 2015-04-04 DIAGNOSIS — L237 Allergic contact dermatitis due to plants, except food: Secondary | ICD-10-CM

## 2015-04-04 MED ORDER — PREDNISONE 20 MG PO TABS: ORAL_TABLET | ORAL | Status: DC

## 2015-04-04 NOTE — Progress Notes (Signed)
Pre visit review using our clinic review tool, if applicable. No additional management support is needed unless otherwise documented below in the visit note.  Known poison ivy exposure.  On ankles, elbows, chest, back.  A lot of itching.  Taking benadryl w/o a lot of help.    Meds, vitals, and allergies reviewed.   ROS: See HPI.  Otherwise, noncontributory.  nad ncat Skin with blanching irregular rash on the ankles, elbows, chest, back

## 2015-04-04 NOTE — Patient Instructions (Signed)
Prednisone with food, taper the dose.   Update me as needed.  Take care.  Glad to see you.

## 2015-04-05 ENCOUNTER — Encounter: Payer: Self-pay | Admitting: Family Medicine

## 2015-04-05 DIAGNOSIS — L237 Allergic contact dermatitis due to plants, except food: Secondary | ICD-10-CM | POA: Insufficient documentation

## 2015-04-05 NOTE — Assessment & Plan Note (Signed)
Diffuse rash, start pred with routine cautions.  He agrees.  F/u prn.

## 2015-09-08 HISTORY — PX: SEPTOPLASTY: SUR1290

## 2016-01-02 ENCOUNTER — Encounter: Payer: Self-pay | Admitting: Family Medicine

## 2017-08-20 ENCOUNTER — Encounter (HOSPITAL_COMMUNITY): Payer: Self-pay

## 2017-08-20 ENCOUNTER — Emergency Department (HOSPITAL_COMMUNITY)
Admission: EM | Admit: 2017-08-20 | Discharge: 2017-08-20 | Disposition: A | Discharge status: Home / Self Care | Payer: Managed Care, Other (non HMO) | Attending: Emergency Medicine | Admitting: Emergency Medicine

## 2017-08-20 DIAGNOSIS — Z5321 Procedure and treatment not carried out due to patient leaving prior to being seen by health care provider: Secondary | ICD-10-CM | POA: Insufficient documentation

## 2017-08-20 DIAGNOSIS — R509 Fever, unspecified: Secondary | ICD-10-CM | POA: Diagnosis present

## 2017-08-20 NOTE — ED Triage Notes (Signed)
PT TOOK TYLENOL ABOUT TWO HOURS AGO

## 2017-08-20 NOTE — ED Triage Notes (Signed)
Pt complains of body aches for three days and a fever today

## 2019-05-29 ENCOUNTER — Ambulatory Visit (INDEPENDENT_AMBULATORY_CARE_PROVIDER_SITE_OTHER): Payer: Managed Care, Other (non HMO) | Admitting: Family Medicine

## 2019-05-29 ENCOUNTER — Encounter: Payer: Self-pay | Admitting: Family Medicine

## 2019-05-29 ENCOUNTER — Other Ambulatory Visit: Payer: Self-pay

## 2019-05-29 VITALS — BP 136/90 | HR 68 | Temp 98.3°F | Ht 74.5 in | Wt 335.1 lb

## 2019-05-29 DIAGNOSIS — F988 Other specified behavioral and emotional disorders with onset usually occurring in childhood and adolescence: Secondary | ICD-10-CM

## 2019-05-29 DIAGNOSIS — Z23 Encounter for immunization: Secondary | ICD-10-CM | POA: Diagnosis not present

## 2019-05-29 DIAGNOSIS — H938X9 Other specified disorders of ear, unspecified ear: Secondary | ICD-10-CM

## 2019-05-29 MED ORDER — FLUTICASONE PROPIONATE 50 MCG/ACT NA SUSP: 1.0000 | Freq: Every day | NASAL | 5 refills | Status: DC

## 2019-05-29 NOTE — Progress Notes (Signed)
His has a baby girl due today but his wife is not yet in labor.  D/w pt about Tdap.    Weight is up.  Some snoring if he doesn't use nasal saline.  S/p septoplasty.  Prev OSA testing done, neg per patient report.  He felt like flonase helped more than astelin.  D/w pt about trial of flonase and stopping astelin.    ADD d/w pt.  Prev on vyvanse with relief.  Prev tested, positive.  Others have noted his variable concentration, wife encouraged him to ask about it.  He is making lists, trying to compensate.  H/o mult speeding tickets.  He uses caffeine daily, 32 oz coffee in the AM, then more caffeine later in the day.    L lower ear lobe irritation.  H/o incision and drainage prev, years ago.  No drainage now.  No FCNAVD.  No R sided sx.  He didn't know if his mask was rubbing the lobe more.   Meds, vitals, and allergies reviewed.   ROS: Per HPI unless specifically indicated in ROS section   nad ncat except for L lower earlobe tender to palpation and puffy.  Not draining. No pre-or postauricular lymphadenopathy. Neck supple, no LA rrr ctab

## 2019-05-29 NOTE — Patient Instructions (Addendum)
Try continuing flonase and stopping astelin.  Update me as needed.   Tdap today.  I would get a flu shot each fall.    I wish you and your family all the best.   Go to [facility] in [place], arrive at 1:40 and wear a mask.   Let me check on options about ADD.   Take care.  Glad to see you.

## 2019-05-31 DIAGNOSIS — H938X9 Other specified disorders of ear, unspecified ear: Secondary | ICD-10-CM | POA: Insufficient documentation

## 2019-05-31 NOTE — Assessment & Plan Note (Signed)
I want to check back through his chart and then we will update the patient regarding options.  Discussed.  He agrees.

## 2019-05-31 NOTE — Assessment & Plan Note (Signed)
I called [facility] and they are able to work the patient in today.  I gave him a map with directions to the office and he will arrive for an appointment at 1:40.  Routine instructions discussed with patient.  I think it would make more sense to see ENT then for me to attempt to drain the lesion here in the clinic.  He agreed.  I appreciate ENT help.

## 2019-06-02 ENCOUNTER — Telehealth: Payer: Self-pay | Admitting: Family Medicine

## 2019-06-02 NOTE — Telephone Encounter (Signed)
I spoke with pt; pt saw Dr Damita Dunnings and ENT on 05/29/19. ENT gave pt abx but now pts lt earlobe is swollen.looks like earlobe is coming to a point like it is going to rupture. Hurting a lot. Pt is going to call ENT back and speak with someone and if cannot be seen there today will go to [facility] to have I&D. Pt also wanted to know if Dr Damita Dunnings had reviewed his chart to see what options are for med for ADD. Pt request cb.[facility].

## 2019-06-02 NOTE — Telephone Encounter (Signed)
Error Pt stated [facility] was calling and he would call back

## 2019-06-02 NOTE — Telephone Encounter (Signed)
[phone] Pt called wanting to know what to do. He stated he saw dr Damita Dunnings for swollen ear lobe.  Dr Damita Dunnings sent him to [facility] they gave him an RX. Pt stated he called [facility] 1 1/2 hours ago to see what he needs to do.  His ear is purple on the back looks like blood is going to come out of it and it has head on it now.   Rena spoke with pt

## 2019-06-02 NOTE — Telephone Encounter (Signed)
I am still working on ADD options and we'll contact him but the ear issue is the priority and I need him to get input from ENT ASAP.  Thanks.

## 2019-06-02 NOTE — Telephone Encounter (Signed)
Patient advised.  Patient says ENT finally called back 2 hours later and wanted him to come in for I & D but in the meantime, his wife went into labor so now he is at the hospital with her and doesn't know when he can go in for that procedure.

## 2019-06-04 NOTE — Telephone Encounter (Signed)
Late entry.  I called to check on the patient later on Friday night.  He is still having some ear pain but he was not having a fever.  He was at the hospital with his wife who is in labor.  I talked with him about options.  It does not make sense for him to leave the hospital at this point.  He is taking antibiotics and will update staff there if his situation is worse in the meantime.  Given his wife's pending delivery I think this is the best option.  He agreed and he will update me as needed.

## 2019-06-12 ENCOUNTER — Telehealth: Payer: Self-pay | Admitting: Family Medicine

## 2019-06-12 NOTE — Telephone Encounter (Signed)
Please check on patient about several items. 1. The last time I talked to him, he was at the hospital with his wife who was in labor.  I hope they are all doing well. 2. He had ear swelling and had seen ENT, I hope that has improved in the meantime. 3. He was asking about ADD treatment and I think it may make sense to restart Vyvanse.  It would make sense to start at a low dose, assuming he can still monitor his blood pressure at home.  Let me know if he is willing to restart that. Thanks.

## 2019-06-12 NOTE — Telephone Encounter (Signed)
1. Wife is doing well and so is the baby girl, just no sleep. 2. His ear popped the day after he was seen in the clinic, he woke up with it running down his face and all of the infection seemed to come out. 3. Patient would like to do Vyvanse treatment and does have a way of checking BP at home.

## 2019-06-13 MED ORDER — LISDEXAMFETAMINE DIMESYLATE 10 MG PO CAPS: 10.0000 mg | ORAL_CAPSULE | Freq: Every day | ORAL | 0 refills | Status: DC

## 2019-06-13 NOTE — Telephone Encounter (Signed)
rx sent for vyvanse.  Please have him update [place] after he has been on med for about 10 days, sooner if needed.  If his blood pressures persistently above 140/above 90, then let us know.  Thanks.

## 2019-06-13 NOTE — Telephone Encounter (Signed)
Received fax from pharmacy stating that Vyvanse is not covered by insurance but Amphetamine sats ER should be. Please review if appropriate to change to this.

## 2019-06-13 NOTE — Addendum Note (Signed)
Addended by: Tonia Ghent on: 06/13/2019 06:47 AM   Modules accepted: Orders

## 2019-06-14 MED ORDER — AMPHETAMINE-DEXTROAMPHET ER 10 MG PO CP24: 10.0000 mg | ORAL_CAPSULE | Freq: Every day | ORAL | 0 refills | Status: DC

## 2019-06-14 NOTE — Addendum Note (Signed)
Addended by: Tonia Ghent on: 06/14/2019 02:05 PM   Modules accepted: Orders

## 2019-06-14 NOTE — Telephone Encounter (Signed)
He tried plain Ritalin before without much relief.  It would be reasonable to try amphetamine salt ER, to see if that has some effect.  Prescription sent.  Please update patient.  Please avoid out of the Vyvanse prescription.  Thanks.

## 2019-06-15 ENCOUNTER — Telehealth: Payer: Self-pay

## 2019-06-15 NOTE — Telephone Encounter (Signed)
I sent rx for adderall as a substitute.  Can pharmacy fill that?

## 2019-06-15 NOTE — Telephone Encounter (Signed)
Received fax from pharmacy stating Vyvanse is not covered by insurance asking for a Rx for Amphetamine Salts ER.  Please advise.

## 2019-06-16 NOTE — Telephone Encounter (Signed)
See other message

## 2019-06-16 NOTE — Telephone Encounter (Signed)
LMTCB to inform patient of medication change.

## 2019-06-20 NOTE — Telephone Encounter (Signed)
Per Morey Hummingbird pt is aware as he returned call

## 2019-07-17 ENCOUNTER — Other Ambulatory Visit: Payer: Self-pay | Admitting: *Deleted

## 2019-07-17 NOTE — Telephone Encounter (Signed)
Patient left a voicemail stating that he needs a refill on his Amphetamine.  Name of Medication: Amphetamine [facility] Last Fill or Written Date and Quantity: 06/14/19 #30 Last Office Visit and Type: 05/15/19 Next Office Visit and Type: none Last Controlled Substance Agreement Date: none Last [facility]

## 2019-07-18 MED ORDER — AMPHETAMINE-DEXTROAMPHET ER 10 MG PO CP24: 10.0000 mg | ORAL_CAPSULE | Freq: Every day | ORAL | 0 refills | Status: DC

## 2019-07-18 NOTE — Telephone Encounter (Signed)
Sent. Thanks.   

## 2019-08-23 ENCOUNTER — Telehealth: Payer: Self-pay

## 2019-08-23 MED ORDER — AMPHETAMINE-DEXTROAMPHET ER 20 MG PO CP24: 20.0000 mg | ORAL_CAPSULE | Freq: Every day | ORAL | 0 refills | Status: DC

## 2019-08-23 NOTE — Telephone Encounter (Signed)
Noted. Thanks.

## 2019-08-23 NOTE — Telephone Encounter (Signed)
Pt notified as instructed and pt voiced understanding and will cb with update in one wk. FYI to Dr Damita Dunnings.

## 2019-08-23 NOTE — Telephone Encounter (Signed)
I would try increasing the dose and see if he can tolerate that/see if it is effective.  Prescription sent.  Please update [place] in about a week.  Thanks.

## 2019-08-23 NOTE — Telephone Encounter (Signed)
Pt said that he wants to see if Adderall 10 mg could be increased; see phone notes about Adderall after 05/29/19 visit. Pt said Adderall 10 mg was not effective for more than couple of hours if pt has not eaten prior to taking med and pt said if he eats he cannot tell any effect at all. Pt said he has trouble focusing and feels tired. Pt request cb after Dr Damita Dunnings reviews note. Pt did not want to schedule appt since discussed at 05/29/19 acute visit. Please advise. [facility]

## 2019-09-26 ENCOUNTER — Other Ambulatory Visit: Payer: Self-pay

## 2019-09-26 NOTE — Telephone Encounter (Signed)
Name of Medication: Adderall XR 20 mg Name of Pharmacy:[facility]  Last Fill or Written Date and Quantity:# 30on 08/23/2019 Last Office Visit and Type:05/29/19 acute&discuss ADD Next Office Visit and Type:none scheduled  Last Controlled Substance Agreement Date: none noted Last KVQ:QVZD noted  +

## 2019-09-27 MED ORDER — AMPHETAMINE-DEXTROAMPHET ER 20 MG PO CP24: 20.0000 mg | ORAL_CAPSULE | Freq: Every day | ORAL | 0 refills | Status: DC

## 2019-09-27 NOTE — Telephone Encounter (Signed)
Sent.  Okay to continue.  Thanks. °

## 2019-11-20 ENCOUNTER — Other Ambulatory Visit: Payer: Self-pay

## 2019-11-20 NOTE — Telephone Encounter (Signed)
Name of Medication: Adderall XR 20 mg Name of Pharmacy:[facility]  Last Fill or Written Date and Quantity: # 30 on 09/27/19 Last Office Visit and Type:05/29/19 ADD  Next Office Visit and Type: none scheduled

## 2019-11-21 MED ORDER — AMPHETAMINE-DEXTROAMPHET ER 20 MG PO CP24: 20.0000 mg | ORAL_CAPSULE | Freq: Every day | ORAL | 0 refills | Status: DC

## 2019-11-21 NOTE — Telephone Encounter (Signed)
Sent. Thanks.   

## 2019-12-25 ENCOUNTER — Other Ambulatory Visit: Payer: Self-pay | Admitting: *Deleted

## 2019-12-25 NOTE — Telephone Encounter (Signed)
Patient left a voicemail stating that he needs a refill on his Adderall XR Last refill 11/21/19 #30 Last office visit 05/29/19 No upcoming appointment scheduled

## 2019-12-26 MED ORDER — AMPHETAMINE-DEXTROAMPHET ER 20 MG PO CP24: 20.0000 mg | ORAL_CAPSULE | Freq: Every day | ORAL | 0 refills | Status: DC

## 2019-12-26 NOTE — Telephone Encounter (Signed)
Sent. Thanks.   

## 2020-01-24 ENCOUNTER — Other Ambulatory Visit: Payer: Self-pay

## 2020-01-24 MED ORDER — AMPHETAMINE-DEXTROAMPHET ER 20 MG PO CP24: 20.0000 mg | ORAL_CAPSULE | Freq: Every day | ORAL | 0 refills | Status: DC

## 2020-01-24 NOTE — Telephone Encounter (Signed)
Name of Medication:Adderall XR 20 mg  Name of Pharmacy: [facility] Last Fill or Written Date and Quantity: # 30 on 12/26/19 Last Office Visit and Type: 05/29/19 discuss ADD & acute visit Next Office Visit and Type: none scheduled

## 2020-01-24 NOTE — Telephone Encounter (Signed)
Sent. Thanks.   

## 2020-02-23 ENCOUNTER — Other Ambulatory Visit: Payer: Self-pay

## 2020-02-23 NOTE — Telephone Encounter (Signed)
Name of Medication:Adderall XR 20 mg  Name of Pharmacy: [facility] Last Fill or Written Date and Quantity: # 30 on 01-24-20 Last Office Visit and Type: 05/29/19 discuss ADD & acute visit Next Office Visit and Type: none scheduled

## 2020-02-25 MED ORDER — AMPHETAMINE-DEXTROAMPHET ER 20 MG PO CP24: 20.0000 mg | ORAL_CAPSULE | Freq: Every day | ORAL | 0 refills | Status: DC

## 2020-02-25 NOTE — Telephone Encounter (Signed)
Sent. Thanks.   

## 2020-04-17 ENCOUNTER — Other Ambulatory Visit: Payer: Self-pay

## 2020-04-17 MED ORDER — AMPHETAMINE-DEXTROAMPHET ER 20 MG PO CP24: 20.0000 mg | ORAL_CAPSULE | Freq: Every day | ORAL | 0 refills | Status: DC

## 2020-04-17 NOTE — Telephone Encounter (Signed)
Name of Medication:Adderall XR 20 mg  Name of Pharmacy: [facility] Last Fill or Written Date and Quantity: #30 on 02/25/20 Last Office Visit and Type: 05/29/19 acute & discussed ADD with pt & 04/04/2015 acute Next Office Visit and Type:no future appt scheduled

## 2020-04-17 NOTE — Telephone Encounter (Signed)
Sent. Thanks.  Needs yearly ADD f/u when possible.

## 2020-04-18 NOTE — Telephone Encounter (Signed)
Appointment scheduled.

## 2020-04-25 ENCOUNTER — Encounter: Payer: Self-pay | Admitting: Family Medicine

## 2020-04-25 ENCOUNTER — Other Ambulatory Visit: Payer: Self-pay

## 2020-04-25 ENCOUNTER — Ambulatory Visit: Payer: BC Managed Care – PPO | Admitting: Family Medicine

## 2020-04-25 DIAGNOSIS — F988 Other specified behavioral and emotional disorders with onset usually occurring in childhood and adolescence: Secondary | ICD-10-CM | POA: Diagnosis not present

## 2020-04-25 MED ORDER — LISDEXAMFETAMINE DIMESYLATE 10 MG PO CAPS: 10.0000 mg | ORAL_CAPSULE | Freq: Every day | ORAL | 0 refills | Status: DC

## 2020-04-25 NOTE — Patient Instructions (Signed)
Finish the adderall then change to vyvanse and update me after that.   Take care.  Glad to see you.

## 2020-04-25 NOTE — Progress Notes (Signed)
This visit occurred during the SARS-CoV-2 public health emergency.  Safety protocols were in place, including screening questions prior to the visit, additional usage of staff PPE, and extensive cleaning of exam room while observing appropriate contact time as indicated for disinfecting solutions.  His L ear is better.  Discussed.  covid vaccine encouraged, discussed.    ADD.  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 He thought vyvanse helped more with concentration than adderall.  No tremor.  Sleeping well.  Not more irritable on stimulant. He has organizational plan at work.    Meds, vitals, and allergies reviewed.   ROS: Per HPI unless specifically indicated in ROS section   GEN: nad, alert and oriented HEENT: ncat NECK: supple w/o LA CV: rrr. PULM: ctab, no inc wob ABD: soft, +bs EXT: no edema SKIN: no acute rash

## 2020-04-28 NOTE — Assessment & Plan Note (Signed)
Okay to change back to Vyvanse after he finishes his current prescription for Adderall.  He will update me as needed.  Continue working with lists, reminders, etc.  He agrees.  He will update me as needed.

## 2020-06-15 DIAGNOSIS — Z03818 Encounter for observation for suspected exposure to other biological agents ruled out: Secondary | ICD-10-CM | POA: Diagnosis not present

## 2020-06-24 ENCOUNTER — Other Ambulatory Visit: Payer: Self-pay

## 2020-06-24 MED ORDER — LISDEXAMFETAMINE DIMESYLATE 10 MG PO CAPS: 10.0000 mg | ORAL_CAPSULE | Freq: Every day | ORAL | 0 refills | Status: DC

## 2020-06-24 NOTE — Telephone Encounter (Signed)
Name of Medication: Vyvanse 10 mg Name of Pharmacy: [facility] Last Fill or Written Date and Quantity: #30 on 04/25/20 Last Office Visit and Type: 04/25/20 ADD Next Office Visit and Type: none scheduled  pt is out of med but said OK to fill on 06/25/20

## 2020-06-24 NOTE — Telephone Encounter (Signed)
Sent. Thanks.   

## 2020-08-13 ENCOUNTER — Other Ambulatory Visit: Payer: Self-pay | Admitting: *Deleted

## 2020-08-13 ENCOUNTER — Other Ambulatory Visit: Payer: Self-pay | Admitting: Family Medicine

## 2020-08-13 NOTE — Telephone Encounter (Signed)
Patient left a voicemail requesting a refill on Vyvannse Last refill 06/24/20 #30 Last office visit 04/25/20 No upcoming appointment scheduled [facility]

## 2020-08-14 DIAGNOSIS — J020 Streptococcal pharyngitis: Secondary | ICD-10-CM | POA: Diagnosis not present

## 2020-08-14 MED ORDER — LISDEXAMFETAMINE DIMESYLATE 10 MG PO CAPS: 10.0000 mg | ORAL_CAPSULE | Freq: Every day | ORAL | 0 refills | Status: DC

## 2020-08-14 NOTE — Telephone Encounter (Signed)
Sent. Thanks.   

## 2020-08-14 NOTE — Telephone Encounter (Signed)
Sent from other refill request note.

## 2020-08-14 NOTE — Telephone Encounter (Signed)
Pharmacy requests refill on: Lisdexamfetamine 10 mg   LAST REFILL: 06/24/2020 (Q-30, R-0) LAST OV: 04/25/2020 NEXT OV: Not Scheduled  PHARMACY: [id number] [place], [place]

## 2020-10-15 ENCOUNTER — Other Ambulatory Visit: Payer: Self-pay | Admitting: Family Medicine

## 2020-10-15 NOTE — Telephone Encounter (Signed)
Refill request for vyvanse 10 mg caps  LOV - 04/25/20 Next OV - not scheduled Last refilled - 08/14/20 #30/0

## 2020-10-16 MED ORDER — LISDEXAMFETAMINE DIMESYLATE 10 MG PO CAPS: 10.0000 mg | ORAL_CAPSULE | Freq: Every day | ORAL | 0 refills | Status: DC

## 2020-10-16 NOTE — Telephone Encounter (Signed)
Sent. Thanks.   

## 2021-06-09 DIAGNOSIS — J069 Acute upper respiratory infection, unspecified: Secondary | ICD-10-CM | POA: Diagnosis not present

## 2021-06-09 DIAGNOSIS — M791 Myalgia, unspecified site: Secondary | ICD-10-CM | POA: Diagnosis not present

## 2021-06-09 DIAGNOSIS — Z1152 Encounter for screening for COVID-19: Secondary | ICD-10-CM | POA: Diagnosis not present

## 2021-11-06 ENCOUNTER — Ambulatory Visit: Payer: BC Managed Care – PPO | Admitting: Family Medicine

## 2021-11-10 ENCOUNTER — Other Ambulatory Visit: Payer: Self-pay

## 2021-11-10 ENCOUNTER — Encounter: Payer: Self-pay | Admitting: Family Medicine

## 2021-11-10 ENCOUNTER — Ambulatory Visit: Payer: BC Managed Care – PPO | Admitting: Family Medicine

## 2021-11-10 VITALS — BP 126/82 | HR 77 | Temp 97.4°F | Ht 74.5 in | Wt 329.0 lb

## 2021-11-10 DIAGNOSIS — R635 Abnormal weight gain: Secondary | ICD-10-CM

## 2021-11-10 DIAGNOSIS — Z3009 Encounter for other general counseling and advice on contraception: Secondary | ICD-10-CM

## 2021-11-10 LAB — COMPREHENSIVE METABOLIC PANEL
ALT: 24 U/L (ref 0–53)
AST: 21 U/L (ref 0–37)
Albumin: 4.7 g/dL (ref 3.5–5.2)
Alkaline Phosphatase: 49 U/L (ref 39–117)
BUN: 17 mg/dL (ref 6–23)
CO2: 27 mEq/L (ref 19–32)
Calcium: 10.1 mg/dL (ref 8.4–10.5)
Chloride: 100 mEq/L (ref 96–112)
Creatinine, Ser: 0.89 mg/dL (ref 0.40–1.50)
GFR: 114.22 mL/min (ref >60.00)
Glucose, Bld: 91 mg/dL (ref 70–99)
Potassium: 4.2 mEq/L (ref 3.5–5.1)
Sodium: 138 mEq/L (ref 135–145)
Total Bilirubin: 0.6 mg/dL (ref 0.2–1.2)
Total Protein: 7.8 g/dL (ref 6.0–8.3)

## 2021-11-10 LAB — CBC WITH DIFFERENTIAL/PLATELET
Basophils Absolute: 0.1 10*3/uL (ref 0.0–0.1)
Basophils Relative: 1.2 % (ref 0.0–3.0)
Eosinophils Absolute: 0.5 10*3/uL (ref 0.0–0.7)
Eosinophils Relative: 5 % (ref 0.0–5.0)
HCT: 44.8 % (ref 39.0–52.0)
Hemoglobin: 14.9 g/dL (ref 13.0–17.0)
Lymphocytes Relative: 21.2 % (ref 12.0–46.0)
Lymphs Abs: 2.1 10*3/uL (ref 0.7–4.0)
MCHC: 33.2 g/dL (ref 30.0–36.0)
MCV: 85.5 fl (ref 78.0–100.0)
Monocytes Absolute: 0.8 10*3/uL (ref 0.1–1.0)
Monocytes Relative: 8.6 % (ref 3.0–12.0)
Neutro Abs: 6.3 10*3/uL (ref 1.4–7.7)
Neutrophils Relative %: 64 % (ref 43.0–77.0)
Platelets: 300 10*3/uL (ref 150.0–400.0)
RBC: 5.24 Mil/uL (ref 4.22–5.81)
RDW: 14.2 % (ref 11.5–15.5)
WBC: 9.8 10*3/uL (ref 4.0–10.5)

## 2021-11-10 LAB — TSH: TSH: 1.31 u[IU]/mL (ref 0.35–5.50)

## 2021-11-10 NOTE — Patient Instructions (Addendum)
You should get a call about the urology appointment.  ?Go to the lab on the way out.   If you have mychart we'll likely use that to update you.    ? ?Let me see about options and we'll be in touch.  We may need to have you see the GI clinic.   ? ?Take care.  Glad to see you. ?

## 2021-11-10 NOTE — Progress Notes (Signed)
This visit occurred during the SARS-CoV-2 public health emergency.  Safety protocols were in place, including screening questions prior to the visit, additional usage of staff PPE, and extensive cleaning of exam room while observing appropriate contact time as indicated for disinfecting solutions. ? ?He is putting up with seasonal allergies, better than prior, and manageable with allergra and flonase.   ? ?ADD.  He thought vyvanse helped more with concentration than adderall.  he didn't feel much benefit from adderall.  He has been exercising and working on diet but still not losing sig weight.  He is already on strict low carb diet.   ? ?He had sig weight and metabolism changes since his episode of colitis.  Was thin prior to that event.  Brother is thin, so are his parents.  Even with playing football and sig exercise, he still didn't lose weight.  No FCANV.  No GI sx now.  No stool changes.  No black or bloody stools.  He tried probiotics w/o weight change help.   ? ?Vasectomy eval.  Not planning on having more kids.  Referred.   ? ?Meds, vitals, and allergies reviewed.  ? ?ROS: Per HPI unless specifically indicated in ROS section  ? ?GEN: nad, alert and oriented ?HEENT: ncat ?NECK: supple w/o LA, no TMG.   ?CV: rrr. ?PULM: ctab, no inc wob ?ABD: soft, +bs ?EXT: no edema ?SKIN:well perfused.  ?

## 2021-11-12 DIAGNOSIS — R635 Abnormal weight gain: Secondary | ICD-10-CM | POA: Insufficient documentation

## 2021-11-12 DIAGNOSIS — Z3009 Encounter for other general counseling and advice on contraception: Secondary | ICD-10-CM | POA: Insufficient documentation

## 2021-11-12 NOTE — Assessment & Plan Note (Signed)
Refer to urology.  ?

## 2021-11-12 NOTE — Assessment & Plan Note (Signed)
He has had abnormal weight gain throughout life that started after an episode of colitis.  His brother is thin.  His parents thin.  He is not having GI symptoms now.  He is working on diet and exercise at baseline.  I want to consider options and check routine labs today.  See notes on labs. ?

## 2021-11-16 ENCOUNTER — Other Ambulatory Visit: Payer: Self-pay | Admitting: Family Medicine

## 2021-11-16 DIAGNOSIS — R635 Abnormal weight gain: Secondary | ICD-10-CM

## 2021-11-21 ENCOUNTER — Encounter: Payer: Self-pay | Admitting: Urology

## 2021-11-21 ENCOUNTER — Other Ambulatory Visit: Payer: Self-pay

## 2021-11-21 ENCOUNTER — Ambulatory Visit (INDEPENDENT_AMBULATORY_CARE_PROVIDER_SITE_OTHER): Payer: BC Managed Care – PPO | Admitting: Urology

## 2021-11-21 VITALS — BP 129/87 | HR 69 | Ht 74.5 in | Wt 327.0 lb

## 2021-11-21 DIAGNOSIS — Z3009 Encounter for other general counseling and advice on contraception: Secondary | ICD-10-CM

## 2021-11-21 NOTE — Progress Notes (Signed)
? ?11/21/2021 ?8:47 AM  ? ?Tim Alvarado ?22-Sep-1990 ?[id number] ? ?Referring provider: Tonia Ghent, MD ?[address],  [place] [address] ? ?Chief Complaint  ?Patient presents with  ? VAS Consult  ? ? ?HPI: ?Tim Alvarado is a 31 y.o. male who presents for vasectomy counseling. ? ?Married with 2 children ?Denies prior history urologic problems including chronic scrotal pain, epididymitis or orchitis ?No previous history inguinal hernia or pelvic surgery ?No history of bleeding or clotting disorders ? ? ?PMH: ?Past Medical History:  ?Diagnosis Date  ? Allergy   ? ? ?Surgical History: ?Past Surgical History:  ?Procedure Laterality Date  ? ANTERIOR CRUCIATE LIGAMENT REPAIR    ? SEPTOPLASTY  2017  ? ? ?Home Medications:  ?Allergies as of 11/21/2021   ? ?   Reactions  ? Amoxicillin-pot Clavulanate   ? colitis  ? Cefadroxil   ? REACTION: RASH  ? ?  ? ?  ?Medication List  ?  ? ?  ? Accurate as of November 21, 2021  8:47 AM. If you have any questions, ask your nurse or doctor.  ?  ?  ? ?  ? ?ALLEGRA PO ?Take 1 tablet by mouth daily. ?  ?fluticasone 50 MCG/ACT nasal spray ?Commonly known as: FLONASE ?Place 1 spray into both nostrils daily. ?  ? ?  ? ? ?Allergies:  ?Allergies  ?Allergen Reactions  ? Amoxicillin-Pot Clavulanate   ?  colitis  ? Cefadroxil   ?  REACTION: RASH  ? ? ?Family History: ?History reviewed. No pertinent family history. ? ?Social History:  reports that he has never smoked. He has never used smokeless tobacco. He reports current alcohol use. He reports that he does not use drugs. ? ? ?Physical Exam: ?BP 129/87   Pulse 69   Ht 6' 2.5" (1.892 m)   Wt (!) 327 lb (148.3 kg)   BMI 41.42 kg/m?   ?Constitutional:  Alert and oriented, No acute distress. ?HEENT: [place] AT, moist mucus membranes.  Trachea midline, no masses. ?Cardiovascular: No clubbing, cyanosis, or edema. ?Respiratory: Normal respiratory effort, no increased work of breathing. ?GI: Abdomen is soft, nontender, nondistended, no  abdominal masses ?GU: Phallus without lesions, testes descended bilaterally without masses or tenderness, thick spermatic cords bilaterally.  Right vas difficult to palpate but palpable.  Left vas is palpable ?Skin: No rashes, bruises or suspicious lesions. ?Neurologic: Grossly intact, no focal deficits, moving all 4 extremities. ?Psychiatric: Normal mood and affect. ? ? ?Assessment & Plan:   ? ?1.  Undesired fertility ?Desires to schedule vasectomy ?We had a long discussion about vasectomy. We specifically discussed the procedure, recovery and the risks, benefits and alternatives of vasectomy. I explained that the procedure entails removal of a segment of each vas deferens, each of which conducts sperm, and that the purpose of this procedure is to cause sterility (inability to produce children or cause pregnancy). Vasectomy is intended to be permanent and irreversible form of contraception. Options for fertility after vasectomy include vasectomy reversal, or sperm retrieval with in vitro fertilization. These options are not always successful, and they may be expensive. We discussed reversible forms of birth control such as condoms, IUD or diaphragms, as well as the option of freezing sperm in a sperm bank prior to the vasectomy procedure. We discussed the importance of avoiding strenuous exercise for four days after vasectomy, and the importance of refraining from any form of ejaculation for seven days after vasectomy. I explained that vasectomy does not  produce immediate sterility so another form of contraceptive must be used until sterility is assured by having semen checked for sperm. Thus, a post vasectomy semen analysis is necessary to confirm sterility. Rarely, vasectomy must be repeated. We discussed the approximately 1 in 2,000 risk of pregnancy after vasectomy for men who have post-vasectomy semen analysis showing absent sperm or rare non-motile sperm. Typical side effects include a small amount of oozing  blood, some discomfort and mild swelling in the area of incision.  Vasectomy does not affect sexual performance, function, please, sensation, interest, desire, satisfaction, penile erection, volume of semen or ejaculation. Other rare risks include allergy or adverse reaction to an anesthetic, testicular atrophy, hematoma, infection/abscess, prolonged tenderness of the vas deferens, pain, swelling, painful nodule or scar (called sperm granuloma) or epididymtis. We discussed chronic testicular pain syndrome. This has been reported to occur in as many as 1-2% of men and may be permanent. This can be treated with medication, small procedures or (rarely) surgery. ?Valium 10 mg as a preprocedure anxiolytic sent to pharmacy and he was informed he would need a driver if utilizing this medication  ? ? ? ?Abbie Sons, MD ? ?[facility] ?[address] ?[place], [place] [address] ?[phone] ? ?

## 2021-11-21 NOTE — Patient Instructions (Signed)

## 2021-11-22 MED ORDER — DIAZEPAM 10 MG PO TABS
ORAL_TABLET | ORAL | 0 refills | Status: DC
Start: 2021-11-22 — End: 2024-05-30

## 2021-12-18 ENCOUNTER — Encounter: Payer: Self-pay | Admitting: Family Medicine

## 2022-01-09 ENCOUNTER — Encounter: Payer: BC Managed Care – PPO | Admitting: Urology

## 2022-05-08 DIAGNOSIS — J069 Acute upper respiratory infection, unspecified: Secondary | ICD-10-CM | POA: Diagnosis not present

## 2022-05-08 DIAGNOSIS — Z20822 Contact with and (suspected) exposure to covid-19: Secondary | ICD-10-CM | POA: Diagnosis not present

## 2022-05-08 DIAGNOSIS — J029 Acute pharyngitis, unspecified: Secondary | ICD-10-CM | POA: Diagnosis not present

## 2022-07-01 ENCOUNTER — Encounter: Payer: Self-pay | Admitting: Family Medicine

## 2024-04-25 ENCOUNTER — Encounter: Payer: Self-pay | Admitting: Family Medicine

## 2024-05-30 ENCOUNTER — Ambulatory Visit (INDEPENDENT_AMBULATORY_CARE_PROVIDER_SITE_OTHER): Admitting: Family Medicine

## 2024-05-30 VITALS — BP 132/84 | HR 72 | Temp 97.8°F | Ht 74.5 in | Wt 341.8 lb

## 2024-05-30 DIAGNOSIS — R635 Abnormal weight gain: Secondary | ICD-10-CM | POA: Diagnosis not present

## 2024-05-30 DIAGNOSIS — Z0000 Encounter for general adult medical examination without abnormal findings: Secondary | ICD-10-CM

## 2024-05-30 DIAGNOSIS — Z7189 Other specified counseling: Secondary | ICD-10-CM

## 2024-05-30 DIAGNOSIS — R5383 Other fatigue: Secondary | ICD-10-CM

## 2024-05-30 LAB — COMPREHENSIVE METABOLIC PANEL WITH GFR
ALT: 26 U/L (ref 0–53)
AST: 22 U/L (ref 0–37)
Albumin: 4.6 g/dL (ref 3.5–5.2)
Alkaline Phosphatase: 55 U/L (ref 39–117)
BUN: 16 mg/dL (ref 6–23)
CO2: 29 meq/L (ref 19–32)
Calcium: 9.8 mg/dL (ref 8.4–10.5)
Chloride: 99 meq/L (ref 96–112)
Creatinine, Ser: 0.81 mg/dL (ref 0.40–1.50)
GFR: 115.43 mL/min (ref >60.00)
Glucose, Bld: 95 mg/dL (ref 70–99)
Potassium: 4.4 meq/L (ref 3.5–5.1)
Sodium: 137 meq/L (ref 135–145)
Total Bilirubin: 0.8 mg/dL (ref 0.2–1.2)
Total Protein: 7.5 g/dL (ref 6.0–8.3)

## 2024-05-30 LAB — CBC WITH DIFFERENTIAL/PLATELET
Basophils Absolute: 0.1 K/uL (ref 0.0–0.1)
Basophils Relative: 1 % (ref 0.0–3.0)
Eosinophils Absolute: 0.6 K/uL (ref 0.0–0.7)
Eosinophils Relative: 6.3 % — ABNORMAL HIGH (ref 0.0–5.0)
HCT: 44.9 % (ref 39.0–52.0)
Hemoglobin: 14.8 g/dL (ref 13.0–17.0)
Lymphocytes Relative: 24.3 % (ref 12.0–46.0)
Lymphs Abs: 2.4 K/uL (ref 0.7–4.0)
MCHC: 33 g/dL (ref 30.0–36.0)
MCV: 85.7 fl (ref 78.0–100.0)
Monocytes Absolute: 0.8 K/uL (ref 0.1–1.0)
Monocytes Relative: 8.6 % (ref 3.0–12.0)
Neutro Abs: 5.8 K/uL (ref 1.4–7.7)
Neutrophils Relative %: 59.8 % (ref 43.0–77.0)
Platelets: 322 K/uL (ref 150.0–400.0)
RBC: 5.23 Mil/uL (ref 4.22–5.81)
RDW: 14.5 % (ref 11.5–15.5)
WBC: 9.7 K/uL (ref 4.0–10.5)

## 2024-05-30 LAB — TSH: TSH: 1.73 u[IU]/mL (ref 0.35–5.50)

## 2024-05-30 LAB — LIPID PANEL
Cholesterol: 239 mg/dL — ABNORMAL HIGH (ref 0–200)
HDL: 33.6 mg/dL — ABNORMAL LOW (ref >39.00)
LDL Cholesterol: 152 mg/dL — ABNORMAL HIGH (ref 0–99)
NonHDL: 205.73
Total CHOL/HDL Ratio: 7
Triglycerides: 267 mg/dL — ABNORMAL HIGH (ref 0.0–149.0)
VLDL: 53.4 mg/dL — ABNORMAL HIGH (ref 0.0–40.0)

## 2024-05-30 MED ORDER — FLUTICASONE PROPIONATE 50 MCG/ACT NA SUSP: 1.0000 | Freq: Every day | NASAL | Status: AC | PRN

## 2024-05-30 NOTE — Progress Notes (Unsigned)
 CPE- See plan.  Routine anticipatory guidance given to patient.  See health maintenance.  The possibility exists that previously documented standard health maintenance information may have been brought forward from a previous encounter into this note.  If needed, that same information has been updated to reflect the current situation based on today's encounter.    Tetanus 2020 Flu d/w pt. Encouraged, declined.  Covid prev done PNA and shingles not due.  Colon and prostate screening not due.  Living will d/w pt. Wife designated if patient were incapacitated.   Diet and exercise d/w pt.  He is cooking at home, limiting unhealthy foods, eating vegetables, etc.    Taking caffeine for fatigue.  Weight increased by >30 lbs over the last decade.  Fatigue going on for years.  More snoring with more allergy sx.  Mood is good.  Discussed differential diagnosis and potential eval for sleep apnea versus ENT eval after checking his labs.  PMH and SH reviewed  Meds, vitals, and allergies reviewed.   ROS: Per HPI.  Unless specifically indicated otherwise in HPI, the patient denies:  General: fever. Eyes: acute vision changes ENT: sore throat Cardiovascular: chest pain Respiratory: SOB GI: vomiting GU: dysuria Musculoskeletal: acute back pain Derm: acute rash Neuro: acute motor dysfunction Psych: worsening mood Endocrine: polydipsia Heme: bleeding Allergy: hayfever  GEN: nad, alert and oriented HEENT: mucous membranes moist, OP wnl except for B tonsillar enlargement.   NECK: supple w/o LA, 19 neck.   CV: rrr. PULM: ctab, no inc wob ABD: soft, +bs EXT: no edema SKIN: no acute rash but some mild hyperpigmentation on the B medial shins.

## 2024-05-30 NOTE — Patient Instructions (Signed)
 Go to the lab on the way out.   If you have mychart we'll likely use that to update you.    Take care.  Glad to see you.

## 2024-05-31 ENCOUNTER — Ambulatory Visit: Payer: Self-pay | Admitting: Family Medicine

## 2024-05-31 DIAGNOSIS — Z0000 Encounter for general adult medical examination without abnormal findings: Secondary | ICD-10-CM | POA: Insufficient documentation

## 2024-05-31 DIAGNOSIS — Z7189 Other specified counseling: Secondary | ICD-10-CM | POA: Insufficient documentation

## 2024-05-31 DIAGNOSIS — R5383 Other fatigue: Secondary | ICD-10-CM | POA: Insufficient documentation

## 2024-05-31 NOTE — Assessment & Plan Note (Signed)
 Living will d/w pt.  Wife designated if patient were incapacitated.   ?

## 2024-05-31 NOTE — Assessment & Plan Note (Signed)
 Weight increased by >30 lbs over the last decade.  Fatigue going on for years.  More snoring with more allergy sx.  Mood is good.  Discussed differential diagnosis and potential eval for sleep apnea versus ENT eval after checking his labs.  He has tonsillar enlargement and a 19 inch neck.

## 2024-05-31 NOTE — Assessment & Plan Note (Signed)
 Tetanus 2020 Flu d/w pt. Encouraged, declined.  Covid prev done PNA and shingles not due.  Colon and prostate screening not due.  Living will d/w pt. Wife designated if patient were incapacitated.   Diet and exercise d/w pt.  He is cooking at home, limiting unhealthy foods, eating vegetables, etc.

## 2024-07-02 ENCOUNTER — Other Ambulatory Visit: Payer: Self-pay | Admitting: Family Medicine

## 2024-07-02 DIAGNOSIS — R5383 Other fatigue: Secondary | ICD-10-CM

## 2024-07-06 ENCOUNTER — Ambulatory Visit: Admitting: Sleep Medicine

## 2024-07-19 ENCOUNTER — Encounter: Payer: Self-pay | Admitting: Sleep Medicine

## 2024-07-19 ENCOUNTER — Ambulatory Visit (INDEPENDENT_AMBULATORY_CARE_PROVIDER_SITE_OTHER): Admitting: Sleep Medicine

## 2024-07-19 VITALS — BP 128/84 | HR 72 | Ht 74.5 in | Wt 345.0 lb

## 2024-07-19 DIAGNOSIS — G4733 Obstructive sleep apnea (adult) (pediatric): Secondary | ICD-10-CM

## 2024-07-19 NOTE — Patient Instructions (Signed)
 Tim Alvarado

## 2024-07-19 NOTE — Progress Notes (Signed)
 Name:Tim Alvarado MRN: [id number] DOB: May 11, 1991   CHIEF COMPLAINT:  EXCESSIVE DAYTIME SLEEPINESS   HISTORY OF PRESENT ILLNESS: Tim Alvarado is a 33 y.o. w/ a h/o allergic rhinitis and morbid obesity who presents for intermittent snoring and occasional daytime sleepiness which has been present for several years. Reports nocturnal awakenings due to nocturia, however does not have difficulty falling back to sleep. Reports significant weight changes. Admits to dry mouth. Denies morning headaches, RLS symptoms, dream enactment, cataplexy, hypnagogic or hypnapompic hallucinations. Denies a family history of sleep apnea. Denies drowsy driving. Drinks 1 energy drink a few times per week, occasional alcohol use, denies tobacco or illicit drug use.   Bedtime 10-11 pm Sleep onset 10 mins Rise time 5 am   EPWORTH SLEEP SCORE 6    07/19/2024    8:44 AM  Results of the Epworth flowsheet  Sitting and reading 2  Watching TV 0  Sitting, inactive in a public place (e.g. a theatre or a meeting) 0  As a passenger in a car for an hour without a break 1  Lying down to rest in the afternoon when circumstances permit 3  Sitting and talking to someone 0  Sitting quietly after a lunch without alcohol 0  In a car, while stopped for a few minutes in traffic 0  Total score 6    PAST MEDICAL HISTORY :   has a past medical history of Allergy.  has a past surgical history that includes Anterior cruciate ligament repair and Septoplasty (2017). Prior to Admission medications   Medication Sig Start Date End Date Taking? Authorizing Provider  Fexofenadine HCl (ALLEGRA PO) Take 1 tablet by mouth daily as needed.   Yes [provider]  fluticasone  (FLONASE ) 50 MCG/ACT nasal spray Place 1 spray into both nostrils daily as needed for allergies or rhinitis. 05/30/24  Yes Cleatus Arlyss RAMAN, MD   Allergies  Allergen Reactions   Amoxicillin-Pot Clavulanate     colitis   Cefadroxil     REACTION:  RASH    FAMILY HISTORY:  family history is not on file. SOCIAL HISTORY:  reports that he has never smoked. He has never used smokeless tobacco. He reports current alcohol use. He reports that he does not use drugs.   Review of Systems:  Gen:  Denies  fever, sweats, chills weight loss  HEENT: Denies blurred vision, double vision, ear pain, eye pain, hearing loss, nose bleeds, sore throat Cardiac:  No dizziness, chest pain or heaviness, chest tightness,edema, No JVD Resp:   No cough, -sputum production, -shortness of breath,-wheezing, -hemoptysis,  Gi: Denies swallowing difficulty, stomach pain, nausea or vomiting, diarrhea, constipation, bowel incontinence Gu:  Denies bladder incontinence, burning urine Ext:   Denies Joint pain, stiffness or swelling Skin: Denies  skin rash, easy bruising or bleeding or hives Endoc:  Denies polyuria, polydipsia , polyphagia or weight change Psych:   Denies depression, insomnia or hallucinations  Other:  All other systems negative  VITAL SIGNS: BP 128/84   Pulse 72   Ht 6' 2.5 (1.892 m)   Wt (!) 345 lb (156.5 kg)   SpO2 96%   BMI 43.70 kg/m    Physical Examination:   General Appearance: No distress  EYES PERRLA, EOM intact.   NECK Supple, No JVD Pulmonary: normal breath sounds, No wheezing.  CardiovascularNormal S1,S2.  No m/r/g.   Abdomen: Benign, Soft, non-tender. Skin:   warm, no rashes, no ecchymosis  Extremities: normal, no cyanosis, clubbing.  Neuro:without focal findings,  speech normal  PSYCHIATRIC: Mood, affect within normal limits.   ASSESSMENT AND PLAN  OSA I suspect that OSA is likely present due to clinical presentation. Discussed the consequences of untreated sleep apnea. Advised not to drive drowsy for safety of patient and others. Will complete further evaluation with a home sleep study and follow up to review results.    Morbid obesity Counseled patient on diet and lifestyle modification.   MEDICATION  ADJUSTMENTS/LABS AND TESTS ORDERED: Recommend Sleep Study   Patient  satisfied with Plan of action and management. All questions answered  Follow up to review HST results and treatment plan.   I spent a total of 56 minutes reviewing chart data, face-to-face evaluation with the patient, counseling and coordination of care as detailed above.    Myrick Mcnairy, M.D.  Sleep Medicine [facility]

## 2024-08-16 ENCOUNTER — Ambulatory Visit: Admitting: Sleep Medicine

## 2024-08-17 ENCOUNTER — Encounter

## 2024-08-17 DIAGNOSIS — G4733 Obstructive sleep apnea (adult) (pediatric): Secondary | ICD-10-CM

## 2024-08-18 DIAGNOSIS — R069 Unspecified abnormalities of breathing: Secondary | ICD-10-CM | POA: Diagnosis not present

## 2024-09-04 ENCOUNTER — Encounter (INDEPENDENT_AMBULATORY_CARE_PROVIDER_SITE_OTHER): Payer: Self-pay

## 2024-09-04 ENCOUNTER — Telehealth: Payer: Self-pay

## 2024-09-04 ENCOUNTER — Ambulatory Visit: Admitting: Sleep Medicine

## 2024-09-04 ENCOUNTER — Encounter: Payer: Self-pay | Admitting: Sleep Medicine

## 2024-09-04 VITALS — BP 140/90 | HR 85 | Temp 98.2°F | Ht 74.5 in | Wt 344.8 lb

## 2024-09-04 DIAGNOSIS — G4733 Obstructive sleep apnea (adult) (pediatric): Secondary | ICD-10-CM | POA: Diagnosis not present

## 2024-09-04 DIAGNOSIS — Z6841 Body Mass Index (BMI) 40.0 and over, adult: Secondary | ICD-10-CM | POA: Diagnosis not present

## 2024-09-04 DIAGNOSIS — Z9989 Dependence on other enabling machines and devices: Secondary | ICD-10-CM | POA: Diagnosis not present

## 2024-09-04 MED ORDER — ZEPBOUND 2.5 MG/0.5ML ~~LOC~~ SOAJ: 2.5000 mg | SUBCUTANEOUS | 1 refills | Status: DC

## 2024-09-04 NOTE — Progress Notes (Signed)
 "      Name:Tim Alvarado MRN: [id number] DOB: 1991-04-09   CHIEF COMPLAINT:  HST F/U   HISTORY OF PRESENT ILLNESS: Tim Alvarado is a 33 y.o. w/ a h/o allergic rhinitis and morbid obesity who presents to follow up on HST results. The patient underwent HST which revealed mild OSA (AHI 9, O2 nadir 82%).     EPWORTH SLEEP SCORE 6    07/19/2024    8:44 AM  Results of the Epworth flowsheet  Sitting and reading 2  Watching TV 0  Sitting, inactive in a public place (e.g. a theatre or a meeting) 0  As a passenger in a car for an hour without a break 1  Lying down to rest in the afternoon when circumstances permit 3  Sitting and talking to someone 0  Sitting quietly after a lunch without alcohol 0  In a car, while stopped for a few minutes in traffic 0  Total score 6    PAST MEDICAL HISTORY :   has a past medical history of Allergy.  has a past surgical history that includes Anterior cruciate ligament repair and Septoplasty (2017). Prior to Admission medications   Medication Sig Start Date End Date Taking? Authorizing Provider  Fexofenadine HCl (ALLEGRA PO) Take 1 tablet by mouth daily as needed.   Yes [provider]  fluticasone  (FLONASE ) 50 MCG/ACT nasal spray Place 1 spray into both nostrils daily as needed for allergies or rhinitis. 05/30/24  Yes Cleatus Arlyss RAMAN, MD   Allergies  Allergen Reactions   Amoxicillin-Pot Clavulanate     colitis   Cefadroxil     REACTION: RASH    FAMILY HISTORY:  family history includes Cancer in his maternal grandmother; Diabetes in his maternal grandfather. SOCIAL HISTORY:  reports that he has never smoked. He has never used smokeless tobacco. He reports current alcohol use. He reports that he does not use drugs.   Review of Systems:  Gen:  Denies  fever, sweats, chills weight loss  HEENT: Denies blurred vision, double vision, ear pain, eye pain, hearing loss, nose bleeds, sore throat Cardiac:  No dizziness, chest pain or  heaviness, chest tightness,edema, No JVD Resp:   No cough, -sputum production, -shortness of breath,-wheezing, -hemoptysis,  Gi: Denies swallowing difficulty, stomach pain, nausea or vomiting, diarrhea, constipation, bowel incontinence Gu:  Denies bladder incontinence, burning urine Ext:   Denies Joint pain, stiffness or swelling Skin: Denies  skin rash, easy bruising or bleeding or hives Endoc:  Denies polyuria, polydipsia , polyphagia or weight change Psych:   Denies depression, insomnia or hallucinations  Other:  All other systems negative  VITAL SIGNS: BP (!) 140/90 Comment: had an energy drink and theraful DM this morning  Pulse 85   Temp 98.2 F (36.8 C)   Ht 6' 2.5 (1.892 m)   Wt (!) 344 lb 12.8 oz (156.4 kg)   SpO2 98%   BMI 43.68 kg/m    Physical Examination:   General Appearance: No distress  EYES PERRLA, EOM intact.   NECK Supple, No JVD Pulmonary: normal breath sounds, No wheezing.  CardiovascularNormal S1,S2.  No m/r/g.   Abdomen: Benign, Soft, non-tender. Skin:   warm, no rashes, no ecchymosis  Extremities: normal, no cyanosis, clubbing. Neuro:without focal findings,  speech normal  PSYCHIATRIC: Mood, affect within normal limits.   ASSESSMENT AND PLAN  OSA Reviewed HST results with patient. Starting on CPAP therapy set to 4-16 cm H2O. Discussed the consequences of untreated sleep apnea. Advised not  to drive drowsy for safety of patient and others. Will follow up in 3 months.    Morbid obesity Counseled patient on diet and lifestyle modification. Will also try patient on Zepbound  and titrate as tolerated. Denies a family or personal history of thyroid medullary CA or pancreatitis.    Patient  satisfied with Plan of action and management. All questions answered  I spent a total of 25 minutes reviewing chart data, face-to-face evaluation with the patient, counseling and coordination of care as detailed above.    Khyler Urda, M.D.  Sleep  Medicine [facility]        "

## 2024-09-04 NOTE — Telephone Encounter (Signed)
 PA requested

## 2024-09-04 NOTE — Addendum Note (Signed)
 Addended by: Roe Wilner J on: 09/04/2024 11:03 AM   Modules accepted: Orders

## 2024-09-04 NOTE — Telephone Encounter (Signed)
*  Pulm  Pharmacy Patient Advocate Encounter   Received notification from Pt Calls Messages that prior authorization for Zepbound  2.5mg  pen is required/requested.   Insurance verification completed.   The patient is insured through [employer].   PA not submitted due to finding of MILD OSA, office notified

## 2024-09-04 NOTE — Patient Instructions (Signed)

## 2024-10-02 ENCOUNTER — Institutional Professional Consult (permissible substitution) (INDEPENDENT_AMBULATORY_CARE_PROVIDER_SITE_OTHER): Admitting: Family Medicine

## 2024-10-05 ENCOUNTER — Other Ambulatory Visit: Payer: Self-pay | Admitting: Sleep Medicine

## 2024-10-05 DIAGNOSIS — G4733 Obstructive sleep apnea (adult) (pediatric): Secondary | ICD-10-CM

## 2024-10-05 MED ORDER — ZEPBOUND 2.5 MG/0.5ML ~~LOC~~ SOAJ: 2.5000 mg | SUBCUTANEOUS | 0 refills | Status: AC

## 2024-10-05 NOTE — Addendum Note (Signed)
 Addended by: Askia Hazelip J on: 10/05/2024 04:18 PM   Modules accepted: Orders

## 2024-10-11 ENCOUNTER — Telehealth: Payer: Self-pay | Admitting: Sleep Medicine

## 2024-10-11 ENCOUNTER — Encounter: Admitting: Dietician

## 2024-10-11 NOTE — Telephone Encounter (Signed)
 Dr. Jess placed an order for cpap setup on 09/04/24. The order was sent to [facility]. We have received a note from [facility] that they have made multiple attempts to contact the patient reqarding the medical equipment and services ordered. Unfortunately they have been unsuccessful in reaching the patient

## 2024-10-13 ENCOUNTER — Other Ambulatory Visit: Payer: Self-pay | Admitting: Sleep Medicine

## 2024-10-13 DIAGNOSIS — G4733 Obstructive sleep apnea (adult) (pediatric): Secondary | ICD-10-CM

## 2024-10-17 ENCOUNTER — Institutional Professional Consult (permissible substitution) (INDEPENDENT_AMBULATORY_CARE_PROVIDER_SITE_OTHER): Admitting: Family Medicine

## 2024-11-27 ENCOUNTER — Ambulatory Visit: Admitting: Sleep Medicine
